# Patient Record
Sex: Female | Born: 1985 | Race: White | Hispanic: No | Marital: Married | Smoking: Never smoker
Health system: Southern US, Community
[De-identification: ages and names within clinical notes are randomized; demographics above are authoritative.]

## PROBLEM LIST (undated history)

## (undated) DIAGNOSIS — G43909 Migraine, unspecified, not intractable, without status migrainosus: Secondary | ICD-10-CM

## (undated) DIAGNOSIS — F32A Depression, unspecified: Secondary | ICD-10-CM

## (undated) DIAGNOSIS — F419 Anxiety disorder, unspecified: Secondary | ICD-10-CM

## (undated) DIAGNOSIS — I10 Essential (primary) hypertension: Secondary | ICD-10-CM

## (undated) DIAGNOSIS — G932 Benign intracranial hypertension: Secondary | ICD-10-CM

## (undated) DIAGNOSIS — F329 Major depressive disorder, single episode, unspecified: Secondary | ICD-10-CM

## (undated) HISTORY — DX: Depression, unspecified: F32.A

## (undated) HISTORY — DX: Anxiety disorder, unspecified: F41.9

## (undated) HISTORY — DX: Major depressive disorder, single episode, unspecified: F32.9

## (undated) HISTORY — DX: Essential (primary) hypertension: I10

## (undated) HISTORY — PX: CHOLECYSTECTOMY: SHX55

## (undated) HISTORY — DX: Benign intracranial hypertension: G93.2

---
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
## 2005-07-26 ENCOUNTER — Emergency Department: Payer: Self-pay | Admitting: Emergency Medicine

## 2005-11-11 ENCOUNTER — Emergency Department: Payer: Self-pay | Admitting: Emergency Medicine

## 2005-11-13 ENCOUNTER — Ambulatory Visit: Payer: Self-pay | Admitting: Emergency Medicine

## 2006-01-01 ENCOUNTER — Emergency Department: Payer: Self-pay | Admitting: Emergency Medicine

## 2006-08-21 ENCOUNTER — Emergency Department: Payer: Self-pay | Admitting: Internal Medicine

## 2006-11-11 ENCOUNTER — Emergency Department (HOSPITAL_COMMUNITY): Admission: EM | Admit: 2006-11-11 | Discharge: 2006-11-12 | Payer: Self-pay | Admitting: Emergency Medicine

## 2006-11-11 ENCOUNTER — Emergency Department: Payer: Self-pay | Admitting: General Practice

## 2007-03-03 ENCOUNTER — Encounter: Payer: Self-pay | Admitting: Obstetrics and Gynecology

## 2007-03-10 ENCOUNTER — Emergency Department: Payer: Self-pay | Admitting: Emergency Medicine

## 2007-04-02 ENCOUNTER — Observation Stay: Payer: Self-pay | Admitting: Obstetrics and Gynecology

## 2007-04-02 ENCOUNTER — Inpatient Hospital Stay: Payer: Self-pay | Admitting: Obstetrics and Gynecology

## 2008-02-14 ENCOUNTER — Emergency Department: Payer: Self-pay | Admitting: Emergency Medicine

## 2008-03-02 ENCOUNTER — Emergency Department: Payer: Self-pay | Admitting: Emergency Medicine

## 2008-08-11 ENCOUNTER — Emergency Department: Payer: Self-pay | Admitting: Internal Medicine

## 2008-09-23 ENCOUNTER — Emergency Department: Payer: Self-pay | Admitting: Emergency Medicine

## 2008-11-22 ENCOUNTER — Emergency Department: Payer: Self-pay | Admitting: Emergency Medicine

## 2008-11-29 ENCOUNTER — Emergency Department: Payer: Self-pay | Admitting: Unknown Physician Specialty

## 2009-02-07 ENCOUNTER — Observation Stay: Payer: Self-pay

## 2009-03-17 ENCOUNTER — Observation Stay: Payer: Self-pay

## 2009-03-23 ENCOUNTER — Observation Stay: Payer: Self-pay | Admitting: Obstetrics & Gynecology

## 2009-03-24 ENCOUNTER — Observation Stay: Payer: Self-pay

## 2009-03-26 ENCOUNTER — Inpatient Hospital Stay: Payer: Self-pay | Admitting: Obstetrics & Gynecology

## 2009-04-04 ENCOUNTER — Encounter: Payer: Self-pay | Admitting: Maternal & Fetal Medicine

## 2009-04-04 ENCOUNTER — Observation Stay: Payer: Self-pay

## 2009-04-07 ENCOUNTER — Encounter: Payer: Self-pay | Admitting: Obstetrics and Gynecology

## 2009-04-07 ENCOUNTER — Observation Stay: Payer: Self-pay | Admitting: Unknown Physician Specialty

## 2009-04-11 ENCOUNTER — Observation Stay: Payer: Self-pay

## 2009-04-14 ENCOUNTER — Observation Stay: Payer: Self-pay | Admitting: Obstetrics and Gynecology

## 2009-04-18 ENCOUNTER — Observation Stay: Payer: Self-pay | Admitting: Obstetrics & Gynecology

## 2009-04-21 ENCOUNTER — Inpatient Hospital Stay: Payer: Self-pay

## 2009-06-23 ENCOUNTER — Emergency Department: Payer: Self-pay | Admitting: Unknown Physician Specialty

## 2009-07-20 ENCOUNTER — Emergency Department: Payer: Self-pay | Admitting: Emergency Medicine

## 2009-08-09 ENCOUNTER — Emergency Department: Payer: Self-pay | Admitting: Emergency Medicine

## 2009-08-20 ENCOUNTER — Emergency Department: Payer: Self-pay | Admitting: Emergency Medicine

## 2009-10-13 ENCOUNTER — Emergency Department: Payer: Self-pay | Admitting: Emergency Medicine

## 2010-02-07 ENCOUNTER — Emergency Department: Payer: Self-pay | Admitting: Emergency Medicine

## 2010-03-05 ENCOUNTER — Emergency Department: Payer: Self-pay | Admitting: Emergency Medicine

## 2010-03-11 ENCOUNTER — Emergency Department: Payer: Self-pay | Admitting: Emergency Medicine

## 2010-06-18 ENCOUNTER — Emergency Department: Payer: Self-pay | Admitting: Emergency Medicine

## 2010-07-24 ENCOUNTER — Emergency Department: Payer: Self-pay | Admitting: Emergency Medicine

## 2010-08-04 ENCOUNTER — Emergency Department: Payer: Self-pay | Admitting: Emergency Medicine

## 2010-09-13 ENCOUNTER — Observation Stay: Payer: Self-pay

## 2010-09-25 ENCOUNTER — Observation Stay: Payer: Self-pay | Admitting: Obstetrics & Gynecology

## 2010-10-20 ENCOUNTER — Inpatient Hospital Stay: Payer: Self-pay | Admitting: Obstetrics and Gynecology

## 2010-12-17 ENCOUNTER — Emergency Department: Payer: Self-pay | Admitting: Emergency Medicine

## 2010-12-29 ENCOUNTER — Ambulatory Visit: Payer: Self-pay | Admitting: Urology

## 2011-01-02 ENCOUNTER — Ambulatory Visit: Payer: Self-pay | Admitting: Urology

## 2011-01-04 ENCOUNTER — Ambulatory Visit: Payer: Self-pay | Admitting: Urology

## 2011-01-07 ENCOUNTER — Emergency Department: Payer: Self-pay | Admitting: Emergency Medicine

## 2011-02-08 ENCOUNTER — Ambulatory Visit: Payer: Self-pay | Admitting: Urology

## 2011-03-03 ENCOUNTER — Emergency Department: Payer: Self-pay | Admitting: *Deleted

## 2011-05-19 ENCOUNTER — Emergency Department: Payer: Self-pay | Admitting: Emergency Medicine

## 2011-10-27 ENCOUNTER — Emergency Department: Payer: Self-pay | Admitting: Emergency Medicine

## 2012-02-20 ENCOUNTER — Emergency Department: Payer: Self-pay | Admitting: Emergency Medicine

## 2012-03-26 ENCOUNTER — Emergency Department: Payer: Self-pay | Admitting: Emergency Medicine

## 2012-03-26 LAB — URINALYSIS, COMPLETE
Bacteria: NONE SEEN
Glucose,UR: NEGATIVE mg/dL (ref 0–75)
Protein: NEGATIVE
Squamous Epithelial: 1

## 2012-03-26 LAB — PREGNANCY, URINE: Pregnancy Test, Urine: NEGATIVE m[IU]/mL

## 2012-09-12 ENCOUNTER — Emergency Department: Payer: Self-pay | Admitting: Emergency Medicine

## 2012-09-12 LAB — BASIC METABOLIC PANEL
Anion Gap: 5 — ABNORMAL LOW (ref 7–16)
Calcium, Total: 9.3 mg/dL (ref 8.5–10.1)
Co2: 28 mmol/L (ref 21–32)
EGFR (African American): >60
Glucose: 85 mg/dL (ref 65–99)
Osmolality: 279 (ref 275–301)

## 2012-09-12 LAB — CBC
HCT: 43.2 % (ref 35.0–47.0)
Platelet: 317 10*3/uL (ref 150–440)
RBC: 4.85 10*6/uL (ref 3.80–5.20)
RDW: 13.8 % (ref 11.5–14.5)

## 2012-09-12 LAB — URINALYSIS, COMPLETE
Ph: 8 (ref 4.5–8.0)
RBC,UR: 25 /HPF (ref 0–5)
Specific Gravity: 1.013 (ref 1.003–1.030)

## 2012-09-14 ENCOUNTER — Emergency Department: Payer: Self-pay | Admitting: Emergency Medicine

## 2012-09-14 LAB — COMPREHENSIVE METABOLIC PANEL
Albumin: 3.6 g/dL (ref 3.4–5.0)
Alkaline Phosphatase: 137 U/L — ABNORMAL HIGH (ref 50–136)
Anion Gap: 10 (ref 7–16)
BUN: 10 mg/dL (ref 7–18)
Bilirubin,Total: 0.4 mg/dL (ref 0.2–1.0)
Calcium, Total: 9.4 mg/dL (ref 8.5–10.1)
Chloride: 105 mmol/L (ref 98–107)
Co2: 23 mmol/L (ref 21–32)
Creatinine: 0.62 mg/dL (ref 0.60–1.30)
EGFR (African American): >60
EGFR (Non-African Amer.): >60
Glucose: 86 mg/dL (ref 65–99)
Osmolality: 274 (ref 275–301)
Potassium: 4.2 mmol/L (ref 3.5–5.1)
SGOT(AST): 19 U/L (ref 15–37)
SGPT (ALT): 21 U/L (ref 12–78)
Sodium: 138 mmol/L (ref 136–145)
Total Protein: 7.2 g/dL (ref 6.4–8.2)

## 2012-09-15 LAB — TSH: Thyroid Stimulating Horm: 0.434 u[IU]/mL — ABNORMAL LOW

## 2012-09-17 ENCOUNTER — Emergency Department: Payer: Self-pay | Admitting: Emergency Medicine

## 2012-11-08 ENCOUNTER — Ambulatory Visit: Payer: Self-pay | Admitting: Internal Medicine

## 2012-12-22 ENCOUNTER — Emergency Department: Payer: Self-pay | Admitting: Emergency Medicine

## 2013-01-04 ENCOUNTER — Emergency Department: Payer: Self-pay | Admitting: Emergency Medicine

## 2013-01-04 LAB — BASIC METABOLIC PANEL
BUN: 13 mg/dL (ref 7–18)
Calcium, Total: 9 mg/dL (ref 8.5–10.1)
Creatinine: 0.54 mg/dL — ABNORMAL LOW (ref 0.60–1.30)
EGFR (African American): >60
EGFR (Non-African Amer.): >60
Glucose: 77 mg/dL (ref 65–99)
Osmolality: 278 (ref 275–301)
Sodium: 140 mmol/L (ref 136–145)

## 2013-06-14 ENCOUNTER — Emergency Department: Payer: Self-pay | Admitting: Emergency Medicine

## 2013-06-14 LAB — CBC WITH DIFFERENTIAL/PLATELET
Basophil %: 0.5 %
Eosinophil %: 0.8 %
HGB: 13.3 g/dL (ref 12.0–16.0)
MCHC: 33.4 g/dL (ref 32.0–36.0)
MCV: 89 fL (ref 80–100)
Monocyte %: 7 %
Neutrophil %: 73.5 %
RBC: 4.46 10*6/uL (ref 3.80–5.20)
WBC: 14.1 10*3/uL — ABNORMAL HIGH (ref 3.6–11.0)

## 2013-06-14 LAB — URINALYSIS, COMPLETE
Bacteria: NONE SEEN
Blood: NEGATIVE
Glucose,UR: NEGATIVE mg/dL (ref 0–75)
Leukocyte Esterase: NEGATIVE
Nitrite: POSITIVE
Specific Gravity: 1.033 (ref 1.003–1.030)
Squamous Epithelial: 7
WBC UR: 2 /HPF (ref 0–5)

## 2013-06-14 LAB — COMPREHENSIVE METABOLIC PANEL
Anion Gap: 6 — ABNORMAL LOW (ref 7–16)
Chloride: 108 mmol/L — ABNORMAL HIGH (ref 98–107)
Co2: 24 mmol/L (ref 21–32)
EGFR (Non-African Amer.): >60
Osmolality: 278 (ref 275–301)
Potassium: 3.8 mmol/L (ref 3.5–5.1)
SGOT(AST): 18 U/L (ref 15–37)
SGPT (ALT): 20 U/L (ref 12–78)

## 2013-06-18 DIAGNOSIS — G932 Benign intracranial hypertension: Secondary | ICD-10-CM

## 2013-06-18 HISTORY — DX: Benign intracranial hypertension: G93.2

## 2013-07-09 ENCOUNTER — Emergency Department: Payer: Self-pay | Admitting: Emergency Medicine

## 2013-08-05 ENCOUNTER — Emergency Department: Payer: Self-pay | Admitting: Emergency Medicine

## 2013-08-05 LAB — URINALYSIS, COMPLETE
Bilirubin,UR: NEGATIVE
Blood: NEGATIVE
GLUCOSE, UR: NEGATIVE mg/dL (ref 0–75)
Ketone: NEGATIVE
Leukocyte Esterase: NEGATIVE
NITRITE: NEGATIVE
PROTEIN: NEGATIVE
Ph: 5 (ref 4.5–8.0)
RBC,UR: 1 /HPF (ref 0–5)
Specific Gravity: 1.023 (ref 1.003–1.030)
Squamous Epithelial: 8

## 2013-08-05 LAB — COMPREHENSIVE METABOLIC PANEL
ALBUMIN: 3.3 g/dL — AB (ref 3.4–5.0)
ALK PHOS: 147 U/L — AB
ALT: 16 U/L (ref 12–78)
AST: 18 U/L (ref 15–37)
Anion Gap: 3 — ABNORMAL LOW (ref 7–16)
BUN: 11 mg/dL (ref 7–18)
Bilirubin,Total: 0.3 mg/dL (ref 0.2–1.0)
CALCIUM: 9.5 mg/dL (ref 8.5–10.1)
CHLORIDE: 106 mmol/L (ref 98–107)
CO2: 27 mmol/L (ref 21–32)
CREATININE: 0.62 mg/dL (ref 0.60–1.30)
EGFR (African American): >60
Glucose: 110 mg/dL — ABNORMAL HIGH (ref 65–99)
OSMOLALITY: 272 (ref 275–301)
POTASSIUM: 4.3 mmol/L (ref 3.5–5.1)
SODIUM: 136 mmol/L (ref 136–145)
Total Protein: 7.6 g/dL (ref 6.4–8.2)

## 2013-08-05 LAB — CBC WITH DIFFERENTIAL/PLATELET
BASOS ABS: 0.1 10*3/uL (ref 0.0–0.1)
Basophil %: 0.7 %
EOS ABS: 0.2 10*3/uL (ref 0.0–0.7)
EOS PCT: 1.1 %
HCT: 42 % (ref 35.0–47.0)
HGB: 14 g/dL (ref 12.0–16.0)
Lymphocyte #: 3.6 10*3/uL (ref 1.0–3.6)
Lymphocyte %: 26.9 %
MCH: 30.3 pg (ref 26.0–34.0)
MCHC: 33.4 g/dL (ref 32.0–36.0)
MCV: 91 fL (ref 80–100)
MONO ABS: 0.6 x10 3/mm (ref 0.2–0.9)
MONOS PCT: 4.5 %
NEUTROS ABS: 9 10*3/uL — AB (ref 1.4–6.5)
Neutrophil %: 66.8 %
PLATELETS: 313 10*3/uL (ref 150–440)
RBC: 4.62 10*6/uL (ref 3.80–5.20)
RDW: 13.9 % (ref 11.5–14.5)
WBC: 13.5 10*3/uL — ABNORMAL HIGH (ref 3.6–11.0)

## 2013-08-05 LAB — PREGNANCY, URINE: Pregnancy Test, Urine: NEGATIVE m[IU]/mL

## 2014-08-06 ENCOUNTER — Ambulatory Visit: Payer: Self-pay | Admitting: Ophthalmology

## 2014-08-19 ENCOUNTER — Emergency Department: Payer: Self-pay | Admitting: Emergency Medicine

## 2014-09-07 ENCOUNTER — Ambulatory Visit: Payer: Self-pay | Admitting: Neurology

## 2014-09-18 LAB — CSF CULTURE

## 2015-02-16 ENCOUNTER — Other Ambulatory Visit: Payer: Self-pay | Admitting: Neurology

## 2015-02-16 DIAGNOSIS — G932 Benign intracranial hypertension: Secondary | ICD-10-CM

## 2015-02-25 ENCOUNTER — Ambulatory Visit
Admission: RE | Admit: 2015-02-25 | Discharge: 2015-02-25 | Disposition: A | Discharge status: Home / Self Care | Payer: Medicaid Other | Source: Ambulatory Visit | Attending: Neurology | Admitting: Neurology

## 2015-02-25 DIAGNOSIS — G932 Benign intracranial hypertension: Secondary | ICD-10-CM | POA: Insufficient documentation

## 2015-02-25 HISTORY — DX: Migraine, unspecified, not intractable, without status migrainosus: G43.909

## 2015-02-25 LAB — CBC
HCT: 39.9 % (ref 35.0–47.0)
Hemoglobin: 13.2 g/dL (ref 12.0–16.0)
MCH: 29.4 pg (ref 26.0–34.0)
MCHC: 33.1 g/dL (ref 32.0–36.0)
MCV: 88.8 fL (ref 80.0–100.0)
PLATELETS: 313 10*3/uL (ref 150–440)
RBC: 4.5 MIL/uL (ref 3.80–5.20)
RDW: 14.3 % (ref 11.5–14.5)
WBC: 10.3 10*3/uL (ref 3.6–11.0)

## 2015-02-25 LAB — PROTIME-INR
INR: 0.97
PROTHROMBIN TIME: 13.1 s (ref 11.4–15.0)

## 2015-02-25 LAB — APTT: APTT: 31 s (ref 24–36)

## 2015-02-25 LAB — HCG, QUANTITATIVE, PREGNANCY: hCG, Beta Chain, Quant, S: <1 m[IU]/mL (ref <5)

## 2015-02-25 MED ORDER — ACETAMINOPHEN 500 MG PO TABS
1000.0000 mg | ORAL_TABLET | Freq: Four times a day (QID) | ORAL | Status: DC | PRN
  Administered 2015-02-25: 1000 mg via ORAL
  Filled 2015-02-25: qty 2

## 2015-02-25 NOTE — OR Nursing (Signed)
Dr Allena Katz notified that pt reports headache down to 2/10, requesting to leave, Dr Gildardo Cranker leaving at 2 hour post. Pt informed.

## 2015-02-25 NOTE — OR Nursing (Signed)
Dr Allena Katz notified of headache, he reports he was aware, plan to keep 4 hours for headache, unless change and will reassess length of stay after pain medication administration

## 2015-04-12 ENCOUNTER — Ambulatory Visit (INDEPENDENT_AMBULATORY_CARE_PROVIDER_SITE_OTHER): Payer: Medicaid Other | Admitting: Obstetrics and Gynecology

## 2015-04-12 ENCOUNTER — Other Ambulatory Visit: Payer: Medicaid Other

## 2015-04-12 VITALS — BP 134/84 | HR 83 | Wt 292.3 lb

## 2015-04-12 DIAGNOSIS — N898 Other specified noninflammatory disorders of vagina: Secondary | ICD-10-CM | POA: Diagnosis not present

## 2015-04-12 DIAGNOSIS — Z113 Encounter for screening for infections with a predominantly sexual mode of transmission: Secondary | ICD-10-CM

## 2015-04-12 DIAGNOSIS — Z8759 Personal history of other complications of pregnancy, childbirth and the puerperium: Secondary | ICD-10-CM

## 2015-04-12 DIAGNOSIS — Z3687 Encounter for antenatal screening for uncertain dates: Secondary | ICD-10-CM

## 2015-04-12 DIAGNOSIS — N939 Abnormal uterine and vaginal bleeding, unspecified: Secondary | ICD-10-CM

## 2015-04-12 DIAGNOSIS — Z331 Pregnant state, incidental: Secondary | ICD-10-CM | POA: Diagnosis not present

## 2015-04-12 DIAGNOSIS — R638 Other symptoms and signs concerning food and fluid intake: Secondary | ICD-10-CM

## 2015-04-12 DIAGNOSIS — O34219 Maternal care for unspecified type scar from previous cesarean delivery: Secondary | ICD-10-CM

## 2015-04-12 DIAGNOSIS — Z1389 Encounter for screening for other disorder: Secondary | ICD-10-CM

## 2015-04-12 DIAGNOSIS — Z36 Encounter for antenatal screening of mother: Secondary | ICD-10-CM

## 2015-04-12 NOTE — Patient Instructions (Signed)

## 2015-04-12 NOTE — Progress Notes (Signed)
I have reviewed the information as provided through NOB nurse intake.  Patient with complaints of vaginal spotting for several days, ultrasound performed today for viability, dating. Ultrasound notes viable singleton IUP at 6.2 weeks, [facility].  Bleeding precautions given. RTC in 4 weeks for NOB visit.

## 2015-04-12 NOTE — Progress Notes (Signed)
Barbara Hayes presents for NOB nurse interview visit. G-3.  [id number]. Pregnancy eduction material explained and given. No cats in the home. NOB labs ordered. TSH/HbgA1c due to Increased BMI. HIV labs and Drug screen were explained optional and she could opt out of tests but did not decline. Drug screen ordered. PNV encouraged. Pt would like to do [facility] genetic testing. Pt. To follow up with provider in 4 weeks for NOB physical or sooner if problems. Pt had some vaginal bleeding when wiped all day Sunday and last night when wiped-bright red. Pt had fraternal twins (conceived 5 days apart) [facility]. Ultrasound ordered for viability and dating, hx twins, and vaginal spotting. This was done today. Pt measured 6wks 2days by US. One fetus noted. All questions answered.    ZIKA EXPOSURE SCREEN:  The patient has not traveled to a [place] endemic area within the past 6 months, nor has she had unprotected sex with a partner who has travelled to a [place] endemic region within the past 6 months. The patient has been advised to notify us if these factors change any time during this current pregnancy, so adequate testing and monitoring can be initiated.

## 2015-04-13 LAB — CBC WITH DIFFERENTIAL/PLATELET
BASOS ABS: 0 10*3/uL (ref 0.0–0.2)
Basos: 0 %
EOS (ABSOLUTE): 0.2 10*3/uL (ref 0.0–0.4)
EOS: 2 %
HEMOGLOBIN: 14.2 g/dL (ref 11.1–15.9)
Hematocrit: 42.1 % (ref 34.0–46.6)
IMMATURE GRANULOCYTES: 0 %
Immature Grans (Abs): 0 10*3/uL (ref 0.0–0.1)
LYMPHS ABS: 2.7 10*3/uL (ref 0.7–3.1)
Lymphs: 27 %
MCH: 30 pg (ref 26.6–33.0)
MCHC: 33.7 g/dL (ref 31.5–35.7)
MCV: 89 fL (ref 79–97)
MONOCYTES: 7 %
MONOS ABS: 0.7 10*3/uL (ref 0.1–0.9)
NEUTROS PCT: 64 %
Neutrophils Absolute: 6.5 10*3/uL (ref 1.4–7.0)
Platelets: 356 10*3/uL (ref 150–379)
RBC: 4.73 x10E6/uL (ref 3.77–5.28)
RDW: 14.3 % (ref 12.3–15.4)
WBC: 10.1 10*3/uL (ref 3.4–10.8)

## 2015-04-13 LAB — TSH: TSH: 0.804 u[IU]/mL (ref 0.450–4.500)

## 2015-04-13 LAB — PAIN MGT SCRN (14 DRUGS), UR
AMPHETAMINE SCRN UR: NEGATIVE ng/mL
BUPRENORPHINE, URINE: NEGATIVE ng/mL
Barbiturate Screen, Ur: NEGATIVE ng/mL
Benzodiazepine Screen, Urine: NEGATIVE ng/mL
COCAINE(METAB.) SCREEN, URINE: NEGATIVE ng/mL
Cannabinoids Ur Ql Scn: NEGATIVE ng/mL
Creatinine(Crt), U: 91.2 mg/dL (ref 20.0–300.0)
FENTANYL, URINE: NEGATIVE pg/mL
MEPERIDINE SCREEN, URINE: NEGATIVE ng/mL
METHADONE SCREEN, URINE: NEGATIVE ng/mL
OPIATE SCRN UR: NEGATIVE ng/mL
OXYCODONE+OXYMORPHONE UR QL SCN: NEGATIVE ng/mL
PCP SCRN UR: NEGATIVE ng/mL
PROPOXYPHENE SCREEN: NEGATIVE ng/mL
Ph of Urine: 6.1 (ref 4.5–8.9)
TRAMADOL UR QL SCN: NEGATIVE ng/mL

## 2015-04-13 LAB — VARICELLA ZOSTER ANTIBODY, IGM

## 2015-04-13 LAB — URINALYSIS, ROUTINE W REFLEX MICROSCOPIC
Bilirubin, UA: NEGATIVE
GLUCOSE, UA: NEGATIVE
Ketones, UA: NEGATIVE
Leukocytes, UA: NEGATIVE
NITRITE UA: NEGATIVE
PH UA: 6.5 (ref 5.0–7.5)
Protein, UA: NEGATIVE
RBC, UA: NEGATIVE
Specific Gravity, UA: 1.017 (ref 1.005–1.030)
UUROB: 0.2 mg/dL (ref 0.2–1.0)

## 2015-04-13 LAB — RUBELLA ANTIBODY, IGM: Rubella IgM: <20 AU/mL (ref 0.0–19.9)

## 2015-04-13 LAB — GC/CHLAMYDIA PROBE AMP
CHLAMYDIA, DNA PROBE: NEGATIVE
NEISSERIA GONORRHOEAE BY PCR: NEGATIVE

## 2015-04-13 LAB — RPR: RPR Ser Ql: NONREACTIVE

## 2015-04-13 LAB — HIV ANTIBODY (ROUTINE TESTING W REFLEX): HIV Screen 4th Generation wRfx: NONREACTIVE

## 2015-04-13 LAB — NICOTINE SCREEN, URINE: Cotinine Ql Scrn, Ur: NEGATIVE ng/mL

## 2015-04-13 LAB — ANTIBODY SCREEN: ANTIBODY SCREEN: NEGATIVE

## 2015-04-13 LAB — ABO

## 2015-04-13 LAB — HEMOGLOBIN A1C
Est. average glucose Bld gHb Est-mCnc: 105 mg/dL
HEMOGLOBIN A1C: 5.3 % (ref 4.8–5.6)

## 2015-04-13 LAB — RH TYPE: Rh Factor: POSITIVE

## 2015-04-13 LAB — HEPATITIS B SURFACE ANTIGEN: Hepatitis B Surface Ag: NEGATIVE

## 2015-04-14 LAB — URINE CULTURE

## 2015-04-18 ENCOUNTER — Encounter: Payer: Self-pay | Admitting: Certified Nurse Midwife

## 2015-04-20 ENCOUNTER — Telehealth: Payer: Self-pay | Admitting: Obstetrics and Gynecology

## 2015-04-20 MED ORDER — AZITHROMYCIN 250 MG PO TABS: ORAL_TABLET | ORAL | Status: DC

## 2015-04-20 NOTE — Telephone Encounter (Signed)
PT CALLED AND SHE HAS BEEN BATTLING SINUS CONGESTION AND PRESSURE FOR ABOUT 2 WEEKS NOT, SHE CAME IN LAST WEEK FOR HER NURSE OB INTAKE AND GOT THE MEDICATIONS THAT SHE CAN TAKE OVER THE COUNTER, AND SHE HAS BEEN TAKING THE, FOR ABOUT A WEEK AND HAS GOTTEN WORSE, SHE SEES YOU ON 11/22 FOR HER NEW OB PHYSICAL, SHE WANTED TO KNOW IF SHE COULD GET AN ANTIBIOTIC CALLED IN FOR HER, HER SYMPTOMS, CONGESTION, COUGHING EAR PAIN, CHILLS, NO FEVER, SORE THROAT. SHE USES [facility] IN [place].

## 2015-04-20 NOTE — Telephone Encounter (Signed)
Sure.  I can call in a Z-pack for her.  Continue to encourage her to use robitussion/throat lozenges for cough, and can use saline nasal spray for congestion and can use Mucinex (just the plain, no DM).

## 2015-04-21 MED ORDER — AMOXICILLIN-POT CLAVULANATE 875-125 MG PO TABS: 1.0000 | ORAL_TABLET | Freq: Two times a day (BID) | ORAL | Status: DC

## 2015-04-21 NOTE — Telephone Encounter (Signed)
I will change it to Amoxcillin.

## 2015-04-21 NOTE — Telephone Encounter (Signed)
CALLED PT AND SHE STATED THAT HER MEDICAID WILL NOT COVER THE Z-PACK, IS THERE SOMETHING ELSE THAT CAN BE CALLED IN, SHE WILL CONTINUE TO USE ALL THE OTHER THINGS AS WELL.

## 2015-04-21 NOTE — Telephone Encounter (Signed)
Contacted patient regarding medication.  Discussed allergy history of PCN, notes that she can take other forms of PCN, just not PCN G.  Will prescribe Augmentin BID x 7 days

## 2015-04-21 NOTE — Addendum Note (Signed)
Addended by: Fabian NovemberHERRY, Slater Mcmanaman S on: 04/21/2015 05:34 PM   Modules accepted: Orders

## 2015-04-21 NOTE — Addendum Note (Signed)
Addended by: Fabian NovemberHERRY, Eastin Swing S on: 04/21/2015 05:31 PM   Modules accepted: Orders, Medications

## 2015-05-09 ENCOUNTER — Telehealth: Payer: Self-pay

## 2015-05-09 NOTE — Telephone Encounter (Signed)
Ob pt states she was having a sharp pain on left side of abd that radiates to the back last nite. Came and went. This am she had some spotting on the TP when she wiped. NO uti sx. NO v/d. No fever. Mild nausea. Tylenol helps with the cramps. Pt does have a nob PE in the am. [facility] Advised she can come by office for u/a and culture. Pt states she will monitor for now. If bleeding gets heavy or cramps not relived by tylenol then she needs to go to the ER. Pt voices understanding.

## 2015-05-10 ENCOUNTER — Ambulatory Visit (INDEPENDENT_AMBULATORY_CARE_PROVIDER_SITE_OTHER): Payer: Medicaid Other | Admitting: Obstetrics and Gynecology

## 2015-05-10 ENCOUNTER — Ambulatory Visit: Payer: Medicaid Other

## 2015-05-10 ENCOUNTER — Encounter: Payer: Self-pay | Admitting: Obstetrics and Gynecology

## 2015-05-10 VITALS — BP 133/83 | HR 83 | Wt 290.6 lb

## 2015-05-10 DIAGNOSIS — Z3491 Encounter for supervision of normal pregnancy, unspecified, first trimester: Secondary | ICD-10-CM

## 2015-05-10 DIAGNOSIS — R399 Unspecified symptoms and signs involving the genitourinary system: Secondary | ICD-10-CM

## 2015-05-10 DIAGNOSIS — Z8679 Personal history of other diseases of the circulatory system: Secondary | ICD-10-CM

## 2015-05-10 DIAGNOSIS — Z6841 Body Mass Index (BMI) 40.0 and over, adult: Secondary | ICD-10-CM

## 2015-05-10 DIAGNOSIS — R7309 Other abnormal glucose: Secondary | ICD-10-CM | POA: Diagnosis not present

## 2015-05-10 LAB — POCT URINALYSIS DIPSTICK
Bilirubin, UA: NEGATIVE
GLUCOSE UA: NEGATIVE
Ketones, UA: NEGATIVE
Leukocytes, UA: NEGATIVE
Nitrite, UA: NEGATIVE
Protein, UA: NEGATIVE
SPEC GRAV UA: 1.015
Urobilinogen, UA: NEGATIVE
pH, UA: 6.5

## 2015-05-10 MED ORDER — VENLAFAXINE HCL ER 75 MG PO CP24: 75.0000 mg | ORAL_CAPSULE | Freq: Every day | ORAL | Status: AC

## 2015-05-10 MED ORDER — PRENATAL 27-0.8 MG PO TABS: 1.0000 | ORAL_TABLET | Freq: Every day | ORAL | Status: AC

## 2015-05-10 NOTE — Progress Notes (Signed)
INITIAL OBSTETRIC PRENATAL CLINIC PROGRESS NOTE  Subjective:    Barbara Hayes is being seen today for her first obstetrical visit.  This is not a planned pregnancy. She is at [redacted]w[redacted]d gestation by Patient's last menstrual period was 02/25/2015 (approximate). Estimated Date of Delivery: 12/02/15. Her obstetrical history is significant for pregnancy induced hypertension in a prior pregnancy, anxiety/depression, h/o preterm delivery x 2, migraines, morbid obesity, and h/o prior C-section x 1 with subsequent VBAC. Relationship with FOB: spouse, living together. Patient does intend to breast feed. Pregnancy history fully reviewed.  Menstrual History: Obstetric History   G4   P3   T1   P2   A0   TAB0   SAB0   E0   M1   L4     # Outcome Date GA Lbr Len/2nd Weight Sex Delivery Anes PTL Lv  4 Current           3 Preterm 2012 [redacted]w[redacted]d  6 lb 6 oz (2.892 kg) F VBAC   Y  2A Preterm 2010 [redacted]w[redacted]d  5 lb (2.268 kg) F Vag-Spont   Y  2B Preterm 2010 [redacted]w[redacted]d  4 lb 6 oz (1.984 kg) F CS-Unspec   Y  1 Term 2008 [redacted]w[redacted]d  7 lb 9 oz (3.43 kg) M Vag-Spont   Y    Obstetric Comments  G1: Delivery problem: vacum needed, tore, then cut and was stitched from inside out; put to sleep to do the surgery and had blood transfusion.  G2: Gestational HTN requiring medications until 6 weeks postpartum.  Twin pregnancy    Menarche age: 33 Patient's last menstrual period was 02/25/2015 (approximate).  Denies h/o abnormal pap smears or STIs.  Last pap smear was ~ 1-2 years ago.   Past Medical History  Diagnosis Date  . Migraines   . Hypertension   . IIH (idiopathic intracranial hypertension) 2015    Past Surgical History  Procedure Laterality Date  . Cesarean section      Family History  Problem Relation Age of Onset  . Hypertension Father     Social History   Social History  . Marital Status: Married    Spouse Name: N/A  . Number of Children: N/A  . Years of Education: N/A   Occupational History  . Not on file.    Social History Main Topics  . Smoking status: Never Smoker   . Smokeless tobacco: Never Used  . Alcohol Use: No  . Drug Use: No  . Sexual Activity:    Partners: Male   Other Topics Concern  . Not on file   Social History Narrative    Current Outpatient Prescriptions on File Prior to Visit  Medication Sig Dispense Refill  . busPIRone (BUSPAR) 7.5 MG tablet Take 7.5 mg by mouth 2 (two) times daily.    Marland Kitchen LORazepam (ATIVAN) 0.5 MG tablet Take 0.5 mg by mouth every 8 (eight) hours as needed for anxiety.    . traZODone (DESYREL) 50 MG tablet Take 50 mg by mouth at bedtime.     No current facility-administered medications on file prior to visit.    Allergies  Allergen Reactions  . Penicillin G Anaphylaxis  . Sulfa Antibiotics Anaphylaxis    Review of Systems General:Not Present- Fever, Weight Loss and Weight Gain. Skin:Not Present- Rash. HEENT:Present - Headaches. Not Present- Blurred Vision, Bleeding Gums. Respiratory:Not Present- Difficulty Breathing. Breast:Not Present- Breast Mass. Cardiovascular:Not Present- Chest Pain, Elevated Blood Pressure, Fainting / Blacking Out and Shortness of Breath. Gastrointestinal:Not Present- Abdominal  Pain, Constipation, Nausea and Vomiting. Female Genitourinary:Not Present- Frequency, Painful Urination, Pelvic Pain, Vaginal Bleeding, Vaginal Discharge, Contractions, regular, Fetal Movements Decreased, Urinary Complaints and Vaginal Fluid. Musculoskeletal:Not Present- Back Pain and Leg Cramps. Neurological:Not Present- Dizziness. Psychiatric:Present - Anxiety. Not Present- Depression.    Objective:  Blood pressure 133/83, pulse 83, weight 290 lb 9.6 oz (131.815 kg), last menstrual period 02/25/2015. Body mass index is 53.14 kg/(m^2).    General Appearance:    Alert, cooperative, no distress, appears stated age, morbid obesity  Head:    Normocephalic, without obvious abnormality, atraumatic  Eyes:    PERRL, conjunctiva/corneas  clear, EOM's intact, both eyes  Ears:    Normal external ear canals, both ears  Nose:   Nares normal, septum midline, mucosa normal, no drainage or sinus tenderness  Throat:   Lips, mucosa, and tongue normal; teeth and gums normal  Neck:   Supple, symmetrical, trachea midline, no adenopathy; thyroid: no enlargement/tenderness/nodules; no carotid bruit or JVD  Back:     Symmetric, no curvature, ROM normal, no CVA tenderness  Lungs:     Clear to auscultation bilaterally, respirations unlabored  Chest Wall:    No tenderness or deformity   Heart:    Regular rate and rhythm, S1 and S2 normal, no murmur, rub or gallop  Breast Exam:    No tenderness, masses, or nipple abnormality  Abdomen:     Soft, non-tender, bowel sounds active all four quadrants, no masses, no organomegaly.  FH and FHT unable to be appreciated due to GA and body habitus.  Genitalia:    Pelvic:external genitalia normal, vagina with lesions, discharge, or tenderness, rectovaginal septum  normal. Cervix normal in appearance, no cervical motion tenderness, no adnexal masses or tenderness.  Unable to palpate uterine size due to body habitus.   Rectal:    Normal external sphincter.  No hemorrhoids appreciated. Internal exam not done.   Extremities:   Extremities normal, atraumatic, no cyanosis or edema  Pulses:   2+ and symmetric all extremities  Skin:   Skin color, texture, turgor normal, no rashes or lesions  Lymph nodes:   Cervical, supraclavicular, and axillary nodes normal  Neurologic:   CNII-XII intact, normal strength, sensation and reflexes throughout     Imaging - [facility] 05/10/2015:  Indications:viability Findings:  Singleton intrauterine pregnancy is visualized with a CRL consistent with 11 0/[redacted] weeks gestation, giving an (U/S) EDD of 11/29/15.The (U/S) EDD is consistent with the clinically established (LMP) EDD of 12/02/15.  FHR: 163 CRL measurement: 41 mm Yolk sac was not visualized. Early anatomy is normal.  Right  Ovary measures 3.7 x 2.8 x 3.4 cm. It is normal in appearance. Left Ovary was not visualized. Corpus Luteum was not visualized. Survey of the adnexa demonstrates no adnexal masses. There is no free peritoneal fluid in the cul de sac.  Impression: 1. 11 0/7 week Viable Singleton Intrauterine pregnancy by U/S. 2. (U/S) EDD is consistent with Clinically established (LMP) EDD of 12/02/15.  Recommendations: 1.Clinical correlation with the patient's History and Physical Exam.  Assessment:   Pregnancy at 10 and 4/7 weeks   Pregnancy induced hypertension in a prior pregnancy Anxiety/depression  H/o preterm delivery x 2 (1 delivery was twins) Morbid obesity H/o prior C-section x 1 with subsequent VBAC IIH (Idiopathic Intracranial Hypertension) Migraines   Plan:  1) Pregnancy at 10.4 weeks  Initial labs reviewed. Pap smear up to date.  Prenatal vitamins encouraged. Problem list reviewed and updated. New OB counseling:  The patient  has been given an overview regarding routine prenatal care. Recommendations regarding diet, weight gain, and exercise in pregnancy were given.  Patient advised to gain no more than 15 lbs this pregnancy.  Prenatal testing, optional genetic testing, and ultrasound use in pregnancy were reviewed.  Patient desires genetic testing with [facility]. Benefits of Breast Feeding were discussed. The patient is encouraged to consider nursing her baby post partum.  2) H/o PIH in prior pregnancy Advised on beginning aspirin 81 mg beginning in 2nd trimester Will montior BPs closely  3) Anxiety/depression Patient reports discontinuing all meds after discovering pregnancy.  Notes that depressive symptoms are not present, but anxiety is very high.  Has taken Lorazepam for anxiety on at least 2 occasions.  Advised on discontinuation of Lorazapam. Can resume use of Effexor (was on medication prior to pregnancy and discontinued).  Do not resume Buspar (patient notes it was not  working well for her) and Trazadone.  Discussed need for weaning at approximately 32-34 weeks.   4) H/o preterm delivery x 2 Would recommend beginning 17-OHP for h/o preterm deliveries as patient is at risk for preterm delivery again.  Will also need to be scheduled for cervical length between 14-16 weeks.   5) Morbid obesity Not to gain more than 15 lbs this pregnancy.   Patient may need to be referred to tertiary center for further management of pregnancy due to morbid obesity and anesthesia concerns. To discuss at next visit.   6) H/o prior C-section x 1 with subsequent VBAC  Counseled patient on repeat C-section vs TOLAC.  Desires another TOLAC.  Counseled on risk of uterine rupture at term is 0.78 percent with TOLAC and 0.22 percent with ERCD. 1 in 10 uterine ruptures will result in neonatal death or neurological injury. The benefits of a trial of labor after cesarean (TOLAC) resulting in a vaginal birth after cesarean (VBAC) include the following: shorter length of hospital stay and postpartum recovery (in most cases); fewer complications, such as postpartum fever, wound or uterine infection, thromboembolism (blood clots in the leg or lung), need for blood transfusion and fewer neonatal breathing problems.  The risks of an attempted VBAC or TOLAC include the following: Risk of failed trial of labor after cesarean (TOLAC) without a vaginal birth after cesarean (VBAC) resulting in repeat cesarean delivery (RCD) in about 20 to 40 percent of women who attempt VBAC.  Risk of rupture of uterus resulting in an emergency cesarean delivery. The risk of uterine rupture may be related in part to the type of uterine incision made during the first cesarean delivery. A previous transverse uterine incision has the lowest risk of rupture (0.2 to 1.5 percent risk). Vertical or T-shaped uterine incisions have a higher risk of uterine rupture (4 to 9 percent risk)The risk of fetal death is very low with both VBAC  and elective repeat cesarean delivery (ERCD), but the likelihood of fetal death is higher with VBAC than with ERCD. Maternal death is very rare with either type of delivery. The risks of an elective repeat cesarean delivery (ERCD) were reviewed with the patient including but not limited to: 07/998 risk of uterine rupture which could have serious consequences, bleeding which may require transfusion; infection which may require antibiotics; injury to bowel, bladder or other surrounding organs (bowel, bladder, ureters); injury to the fetus; need for additional procedures including hysterectomy in the event of a life-threatening hemorrhage; thromboembolic phenomenon; abnormal placentation; incisional problems; death and other postoperative or anesthesia complications.   Patient notes  understanding of all and still desires VBAC.    7) H/o IIH Patient notes that symptoms usually include worsening headaches.  Currently has a h/o migraines and PIH in prior pregnancy.  Will need to monitor headaches closely to differentiate type of headache.  Will refer as necessary.  8) Migraines Informed patient that most with migraines improve during the pregnancy.  Can use Tylenol or Excedrin Migraine if needed.    Follow up in 4 weeks.  30% of 45 min visit spent on counseling and coordination of care.      Hildred Laser, MD [facility]

## 2015-05-10 NOTE — Patient Instructions (Signed)
First Trimester of Pregnancy The first trimester of pregnancy is from week 1 until the end of week 12 (months 1 through 3). A week after a sperm fertilizes an egg, the egg will implant on the wall of the uterus. This embryo will begin to develop into a baby. Genes from you and your partner are forming the baby. The female genes determine whether the baby is a boy or a girl. At 6-8 weeks, the eyes and face are formed, and the heartbeat can be seen on ultrasound. At the end of 12 weeks, all the baby's organs are formed.  Now that you are pregnant, you will want to do everything you can to have a healthy baby. Two of the most important things are to get good prenatal care and to follow your health care provider's instructions. Prenatal care is all the medical care you receive before the baby's birth. This care will help prevent, find, and treat any problems during the pregnancy and childbirth. BODY CHANGES Your body goes through many changes during pregnancy. The changes vary from woman to woman.   You may gain or lose a couple of pounds at first.  You may feel sick to your stomach (nauseous) and throw up (vomit). If the vomiting is uncontrollable, call your health care provider.  You may tire easily.  You may develop headaches that can be relieved by medicines approved by your health care provider.  You may urinate more often. Painful urination may mean you have a bladder infection.  You may develop heartburn as a result of your pregnancy.  You may develop constipation because certain hormones are causing the muscles that push waste through your intestines to slow down.  You may develop hemorrhoids or swollen, bulging veins (varicose veins).  Your breasts may begin to grow larger and become tender. Your nipples may stick out more, and the tissue that surrounds them (areola) may become darker.  Your gums may bleed and may be sensitive to brushing and flossing.  Dark spots or blotches (chloasma,  mask of pregnancy) may develop on your face. This will likely fade after the baby is born.  Your menstrual periods will stop.  You may have a loss of appetite.  You may develop cravings for certain kinds of food.  You may have changes in your emotions from day to day, such as being excited to be pregnant or being concerned that something may go wrong with the pregnancy and baby.  You may have more vivid and strange dreams.  You may have changes in your hair. These can include thickening of your hair, rapid growth, and changes in texture. Some women also have hair loss during or after pregnancy, or hair that feels dry or thin. Your hair will most likely return to normal after your baby is born. WHAT TO EXPECT AT YOUR PRENATAL VISITS During a routine prenatal visit:  You will be weighed to make sure you and the baby are growing normally.  Your blood pressure will be taken.  Your abdomen will be measured to track your baby's growth.  The fetal heartbeat will be listened to starting around week 10 or 12 of your pregnancy.  Test results from any previous visits will be discussed. Your health care provider may ask you:  How you are feeling.  If you are feeling the baby move.  If you have had any abnormal symptoms, such as leaking fluid, bleeding, severe headaches, or abdominal cramping.  If you are using any tobacco products,   including cigarettes, chewing tobacco, and electronic cigarettes.  If you have any questions. Other tests that may be performed during your first trimester include:  Blood tests to find your blood type and to check for the presence of any previous infections. They will also be used to check for low iron levels (anemia) and Rh antibodies. Later in the pregnancy, blood tests for diabetes will be done along with other tests if problems develop.  Urine tests to check for infections, diabetes, or protein in the urine.  An ultrasound to confirm the proper growth  and development of the baby.  An amniocentesis to check for possible genetic problems.  Fetal screens for spina bifida and Down syndrome.  You may need other tests to make sure you and the baby are doing well.  HIV (human immunodeficiency virus) testing. Routine prenatal testing includes screening for HIV, unless you choose not to have this test. HOME CARE INSTRUCTIONS  Medicines  Follow your health care provider's instructions regarding medicine use. Specific medicines may be either safe or unsafe to take during pregnancy.  Take your prenatal vitamins as directed.  If you develop constipation, try taking a stool softener if your health care provider approves. Diet  Eat regular, well-balanced meals. Choose a variety of foods, such as meat or vegetable-based protein, fish, milk and low-fat dairy products, vegetables, fruits, and whole grain breads and cereals. Your health care provider will help you determine the amount of weight gain that is right for you.  Avoid raw meat and uncooked cheese. These carry germs that can cause birth defects in the baby.  Eating four or five small meals rather than three large meals a day may help relieve nausea and vomiting. If you start to feel nauseous, eating a few soda crackers can be helpful. Drinking liquids between meals instead of during meals also seems to help nausea and vomiting.  If you develop constipation, eat more high-fiber foods, such as fresh vegetables or fruit and whole grains. Drink enough fluids to keep your urine clear or pale yellow. Activity and Exercise  Exercise only as directed by your health care provider. Exercising will help you:  Control your weight.  Stay in shape.  Be prepared for labor and delivery.  Experiencing pain or cramping in the lower abdomen or low back is a good sign that you should stop exercising. Check with your health care provider before continuing normal exercises.  Try to avoid standing for long  periods of time. Move your legs often if you must stand in one place for a long time.  Avoid heavy lifting.  Wear low-heeled shoes, and practice good posture.  You may continue to have sex unless your health care provider directs you otherwise. Relief of Pain or Discomfort  Wear a good support bra for breast tenderness.   Take warm sitz baths to soothe any pain or discomfort caused by hemorrhoids. Use hemorrhoid cream if your health care provider approves.   Rest with your legs elevated if you have leg cramps or low back pain.  If you develop varicose veins in your legs, wear support hose. Elevate your feet for 15 minutes, 3-4 times a day. Limit salt in your diet. Prenatal Care  Schedule your prenatal visits by the twelfth week of pregnancy. They are usually scheduled monthly at first, then more often in the last 2 months before delivery.  Write down your questions. Take them to your prenatal visits.  Keep all your prenatal visits as directed by your   health care provider. Safety  Wear your seat belt at all times when driving.  Make a list of emergency phone numbers, including numbers for family, friends, the hospital, and police and fire departments. General Tips  Ask your health care provider for a referral to a local prenatal education class. Begin classes no later than at the beginning of month 6 of your pregnancy.  Ask for help if you have counseling or nutritional needs during pregnancy. Your health care provider can offer advice or refer you to specialists for help with various needs.  Do not use hot tubs, steam rooms, or saunas.  Do not douche or use tampons or scented sanitary pads.  Do not cross your legs for long periods of time.  Avoid cat litter boxes and soil used by cats. These carry germs that can cause birth defects in the baby and possibly loss of the fetus by miscarriage or stillbirth.  Avoid all smoking, herbs, alcohol, and medicines not prescribed by  your health care provider. Chemicals in these affect the formation and growth of the baby.  Do not use any tobacco products, including cigarettes, chewing tobacco, and electronic cigarettes. If you need help quitting, ask your health care provider. You may receive counseling support and other resources to help you quit.  Schedule a dentist appointment. At home, brush your teeth with a soft toothbrush and be gentle when you floss. SEEK MEDICAL CARE IF:   You have dizziness.  You have mild pelvic cramps, pelvic pressure, or nagging pain in the abdominal area.  You have persistent nausea, vomiting, or diarrhea.  You have a bad smelling vaginal discharge.  You have pain with urination.  You notice increased swelling in your face, hands, legs, or ankles. SEEK IMMEDIATE MEDICAL CARE IF:   You have a fever.  You are leaking fluid from your vagina.  You have spotting or bleeding from your vagina.  You have severe abdominal cramping or pain.  You have rapid weight gain or loss.  You vomit blood or material that looks like coffee grounds.  You are exposed to German measles and have never had them.  You are exposed to fifth disease or chickenpox.  You develop a severe headache.  You have shortness of breath.  You have any kind of trauma, such as from a fall or a car accident.   This information is not intended to replace advice given to you by your health care provider. Make sure you discuss any questions you have with your health care provider.   Document Released: 05/29/2001 Document Revised: 06/25/2014 Document Reviewed: 04/14/2013 Elsevier Interactive Patient Education 2016 Elsevier Inc.  

## 2015-05-11 ENCOUNTER — Encounter: Payer: Self-pay | Admitting: Obstetrics and Gynecology

## 2015-05-17 ENCOUNTER — Emergency Department
Admission: EM | Admit: 2015-05-17 | Discharge: 2015-05-17 | Disposition: A | Discharge status: Home / Self Care | Payer: Medicaid Other | Attending: Emergency Medicine | Admitting: Emergency Medicine

## 2015-05-17 ENCOUNTER — Emergency Department: Payer: Medicaid Other

## 2015-05-17 DIAGNOSIS — Z880 Allergy status to penicillin: Secondary | ICD-10-CM | POA: Diagnosis not present

## 2015-05-17 DIAGNOSIS — I10 Essential (primary) hypertension: Secondary | ICD-10-CM | POA: Diagnosis not present

## 2015-05-17 DIAGNOSIS — Z3A12 12 weeks gestation of pregnancy: Secondary | ICD-10-CM | POA: Diagnosis not present

## 2015-05-17 DIAGNOSIS — N939 Abnormal uterine and vaginal bleeding, unspecified: Secondary | ICD-10-CM

## 2015-05-17 DIAGNOSIS — O209 Hemorrhage in early pregnancy, unspecified: Secondary | ICD-10-CM | POA: Diagnosis present

## 2015-05-17 DIAGNOSIS — Z79899 Other long term (current) drug therapy: Secondary | ICD-10-CM | POA: Diagnosis not present

## 2015-05-17 DIAGNOSIS — N760 Acute vaginitis: Secondary | ICD-10-CM | POA: Insufficient documentation

## 2015-05-17 DIAGNOSIS — O200 Threatened abortion: Secondary | ICD-10-CM | POA: Diagnosis not present

## 2015-05-17 DIAGNOSIS — B9689 Other specified bacterial agents as the cause of diseases classified elsewhere: Secondary | ICD-10-CM

## 2015-05-17 LAB — CHLAMYDIA/NGC RT PCR (ARMC ONLY)
Chlamydia Tr: NOT DETECTED
N gonorrhoeae: NOT DETECTED

## 2015-05-17 LAB — ABO/RH: ABO/RH(D): O POS

## 2015-05-17 LAB — CBC
HCT: 38.7 % (ref 35.0–47.0)
Hemoglobin: 13 g/dL (ref 12.0–16.0)
MCH: 29.4 pg (ref 26.0–34.0)
MCHC: 33.7 g/dL (ref 32.0–36.0)
MCV: 87.3 fL (ref 80.0–100.0)
PLATELETS: 284 10*3/uL (ref 150–440)
RBC: 4.44 MIL/uL (ref 3.80–5.20)
RDW: 14.1 % (ref 11.5–14.5)
WBC: 8.5 10*3/uL (ref 3.6–11.0)

## 2015-05-17 LAB — URINALYSIS COMPLETE WITH MICROSCOPIC (ARMC ONLY)
BILIRUBIN URINE: NEGATIVE
GLUCOSE, UA: NEGATIVE mg/dL
Ketones, ur: NEGATIVE mg/dL
Nitrite: NEGATIVE
PH: 5 (ref 5.0–8.0)
Protein, ur: 30 mg/dL — AB
Specific Gravity, Urine: 1.02 (ref 1.005–1.030)

## 2015-05-17 LAB — WET PREP, GENITAL
Sperm: NONE SEEN
TRICH WET PREP: NONE SEEN
YEAST WET PREP: NONE SEEN

## 2015-05-17 LAB — HCG, QUANTITATIVE, PREGNANCY: HCG, BETA CHAIN, QUANT, S: 72622 m[IU]/mL — AB (ref <5)

## 2015-05-17 MED ORDER — METOCLOPRAMIDE HCL 10 MG PO TABS: 10.0000 mg | ORAL_TABLET | Freq: Three times a day (TID) | ORAL | Status: DC

## 2015-05-17 MED ORDER — METRONIDAZOLE 500 MG PO TABS: 500.0000 mg | ORAL_TABLET | Freq: Two times a day (BID) | ORAL | Status: AC

## 2015-05-17 NOTE — ED Provider Notes (Signed)
[facility] Emergency Department Provider Note  ____________________________________________  Time seen: Approximately 455 AM  I have reviewed the triage vital signs and the nursing notes.   HISTORY  Chief Complaint Vaginal Bleeding    HPI Barbara Hayes is a 29 y.o. female who comes in today with vaginal bleeding. The patient reports that she woke up at 3 AM and there was blood everywhere. She reports that it was like a period. The patient reports that she has seen doctors for this pregnancy and she is [redacted] weeks pregnant. The patient reports that she was doing well earlier in the day with no vaginal bleeding or belly pain but did have some nausea. The patient is a G4 P1 204. The patient reports she is also had some mild pain with urinationand she woke up. The patient has not had this happen in this pregnancy previously. Patient reports that on her first ultrasound she was told that there was a small collection of blood but it was normal. The patient was concerned she decided to come in for evaluation.   Past Medical History  Diagnosis Date  . Migraines   . Hypertension   . IIH (idiopathic intracranial hypertension) 2015    Has had 2 spinal taps to relieve pressure  . Anxiety and depression     Patient Active Problem List   Diagnosis Date Noted  . H/O cesarean section complicating pregnancy 04/12/2015  . History of twin pregnancy in prior pregnancy 04/12/2015    Past Surgical History  Procedure Laterality Date  . Cesarean section      Current Outpatient Rx  Name  Route  Sig  Dispense  Refill  . busPIRone (BUSPAR) 7.5 MG tablet   Oral   Take 7.5 mg by mouth 2 (two) times daily.         Marland Kitchen LORazepam (ATIVAN) 0.5 MG tablet   Oral   Take 0.5 mg by mouth every 8 (eight) hours as needed for anxiety.         . metoCLOPramide (REGLAN) 10 MG tablet   Oral   Take 1 tablet (10 mg total) by mouth 3 (three) times daily with meals.   12 tablet   0    . metroNIDAZOLE (FLAGYL) 500 MG tablet   Oral   Take 1 tablet (500 mg total) by mouth 2 (two) times daily.   14 tablet   0   . Prenatal Vit-Fe Fumarate-FA (MULTIVITAMIN-PRENATAL) 27-0.8 MG TABS tablet   Oral   Take 1 tablet by mouth daily at 12 noon.   30 each   12   . traZODone (DESYREL) 50 MG tablet   Oral   Take 50 mg by mouth at bedtime.         Marland Kitchen venlafaxine XR (EFFEXOR-XR) 75 MG 24 hr capsule   Oral   Take 1 capsule (75 mg total) by mouth daily with breakfast.   30 capsule   6     Allergies Penicillin g and Sulfa antibiotics  Family History  Problem Relation Age of Onset  . Hypertension Father     Social History Social History  Substance Use Topics  . Smoking status: Never Smoker   . Smokeless tobacco: Never Used  . Alcohol Use: No    Review of Systems Constitutional: No fever/chills Eyes: No visual changes. ENT: No sore throat. Cardiovascular: Denies chest pain. Respiratory: Denies shortness of breath. Gastrointestinal: No abdominal pain.  No nausea, no vomiting.  No diarrhea.  No constipation. Genitourinary:  Vaginal bleeding Musculoskeletal: Negative for back pain. Skin: Negative for rash. Neurological: Negative for headaches, focal weakness or numbness.  10-point ROS otherwise negative.  ____________________________________________   PHYSICAL EXAM:  VITAL SIGNS: ED Triage Vitals  Enc Vitals Group     BP 05/17/15 0350 133/91 mmHg     Pulse Rate 05/17/15 0350 92     Resp 05/17/15 0350 18     Temp 05/17/15 0350 97.6 F (36.4 C)     Temp Source 05/17/15 0350 Oral     SpO2 05/17/15 0350 98 %     Weight 05/17/15 0350 290 lb (131.543 kg)     Height 05/17/15 0350 5\' 2"  (1.575 m)     Head Cir --      Peak Flow --      Pain Score --      Pain Loc --      Pain Edu? --      Excl. in GC? --     Constitutional: Alert and oriented. Well appearing and in no acute distress. Eyes: Conjunctivae are normal. PERRL. EOMI. Head:  Atraumatic. Nose: No congestion/rhinnorhea. Mouth/Throat: Mucous membranes are moist.  Oropharynx non-erythematous. Cardiovascular: Normal rate, regular rhythm. Grossly normal heart sounds.  Good peripheral circulation. Respiratory: Normal respiratory effort.  No retractions. Lungs CTAB. Gastrointestinal: Soft and nontender. No distention. Positive bowel sounds Genitourinary: Normal external genitalia, vaginal discharge with no active bleeding, cervix closed with no cervical motion tenderness, no adnexal or uterine tenderness to palpation Musculoskeletal: No lower extremity tenderness nor edema.   Neurologic:  Normal speech and language.  Skin:  Skin is warm, dry and intact.  Psychiatric: Mood and affect are normal.   ____________________________________________   LABS (all labs ordered are listed, but only abnormal results are displayed)  Labs Reviewed  WET PREP, GENITAL - Abnormal; Notable for the following:    Clue Cells Wet Prep HPF POC FEW (*)    WBC, Wet Prep HPF POC MANY (*)    All other components within normal limits  HCG, QUANTITATIVE, PREGNANCY - Abnormal; Notable for the following:    hCG, Beta Chain, Quant, S [id number] (*)    All other components within normal limits  URINALYSIS COMPLETEWITH MICROSCOPIC ([facility] ONLY) - Abnormal; Notable for the following:    Color, Urine AMBER (*)    APPearance HAZY (*)    Hgb urine dipstick 3+ (*)    Protein, ur 30 (*)    Leukocytes, UA TRACE (*)    Bacteria, UA RARE (*)    Squamous Epithelial / LPF 6-30 (*)    All other components within normal limits  CHLAMYDIA/NGC RT PCR ([facility] ONLY)  CBC  ABO/RH   ____________________________________________  EKG  None ____________________________________________  RADIOLOGY  Ultrasound: Single living intrauterine gestation measuring 11 weeks 6 days by crown-rump length, small subchorionic hemorrhage ____________________________________________   PROCEDURES  Procedure(s)  performed: None  Critical Care performed: No  ____________________________________________   INITIAL IMPRESSION / ASSESSMENT AND PLAN / ED COURSE  Pertinent labs & imaging results that were available during my care of the patient were reviewed by me and considered in my medical decision making (see chart for details).  The patient is a [age] year old female who comes in today with vaginal bleeding. The patient is [redacted] weeks pregnant. The patient did have an ultrasound that was normal. At this point I am awaiting the results of her wet prep.  The patient has some clue cells on her wet preps I will give her some metronidazole and  have her follow-up with Dr. Valentino Saxon in the office for further evaluation. The patient this time has no complaints or concerns and no active bleeding. The patient's blood type is O+ confirmed on previous evaluation in the past. ____________________________________________   FINAL CLINICAL IMPRESSION(S) / ED DIAGNOSES  Final diagnoses:  Threatened miscarriage  Bacterial vaginosis      Rebecka Apley, MD 05/17/15 [phone]

## 2015-05-17 NOTE — ED Notes (Signed)
Pt presents to the ED, [redacted] weeks pregnant with vaginal bleeding x1 day. Pt is AOx4 in no apparent distress.

## 2015-05-17 NOTE — ED Notes (Signed)
Patient ambulatory to triage with steady gait, without difficulty or distress noted; pt reports [redacted]wks pregnant with sudden onset vag bleeding PTA; denies any pain; pt at [facility]; G5 P4 (twin pregnancy); [facility]

## 2015-05-17 NOTE — ED Notes (Signed)
Pt went to ultrasound.

## 2015-05-17 NOTE — ED Notes (Signed)
Pt returned from ultrasound

## 2015-05-17 NOTE — Discharge Instructions (Signed)
Bacterial Vaginosis Bacterial vaginosis is a vaginal infection that occurs when the normal balance of bacteria in the vagina is disrupted. It results from an overgrowth of certain bacteria. This is the most common vaginal infection in women of childbearing age. Treatment is important to prevent complications, especially in pregnant women, as it can cause a premature delivery. CAUSES  Bacterial vaginosis is caused by an increase in harmful bacteria that are normally present in smaller amounts in the vagina. Several different kinds of bacteria can cause bacterial vaginosis. However, the reason that the condition develops is not fully understood. RISK FACTORS Certain activities or behaviors can put you at an increased risk of developing bacterial vaginosis, including:  Having a new sex partner or multiple sex partners.  Douching.  Using an intrauterine device (IUD) for contraception. Women do not get bacterial vaginosis from toilet seats, bedding, swimming pools, or contact with objects around them. SIGNS AND SYMPTOMS  Some women with bacterial vaginosis have no signs or symptoms. Common symptoms include:  Grey vaginal discharge.  A fishlike odor with discharge, especially after sexual intercourse.  Itching or burning of the vagina and vulva.  Burning or pain with urination. DIAGNOSIS  Your health care provider will take a medical history and examine the vagina for signs of bacterial vaginosis. A sample of vaginal fluid may be taken. Your health care provider will look at this sample under a microscope to check for bacteria and abnormal cells. A vaginal pH test may also be done.  TREATMENT  Bacterial vaginosis may be treated with antibiotic medicines. These may be given in the form of a pill or a vaginal cream. A second round of antibiotics may be prescribed if the condition comes back after treatment. Because bacterial vaginosis increases your risk for sexually transmitted diseases, getting  treated can help reduce your risk for chlamydia, gonorrhea, HIV, and herpes. HOME CARE INSTRUCTIONS   Only take over-the-counter or prescription medicines as directed by your health care provider.  If antibiotic medicine was prescribed, take it as directed. Make sure you finish it even if you start to feel better.  Tell all sexual partners that you have a vaginal infection. They should see their health care provider and be treated if they have problems, such as a mild rash or itching.  During treatment, it is important that you follow these instructions:  Avoid sexual activity or use condoms correctly.  Do not douche.  Avoid alcohol as directed by your health care provider.  Avoid breastfeeding as directed by your health care provider. SEEK MEDICAL CARE IF:   Your symptoms are not improving after 3 days of treatment.  You have increased discharge or pain.  You have a fever. MAKE SURE YOU:   Understand these instructions.  Will watch your condition.  Will get help right away if you are not doing well or get worse. FOR MORE INFORMATION  Centers for Disease Control and Prevention, Division of STD Prevention: [employer]raud[employer] American Sexual Health Association (ASHA): www.ashastd.org    This information is not intended to replace advice given to you by your health care provider. Make sure you discuss any questions you have with your health care provider.   Document Released: 06/04/2005 Document Revised: 06/25/2014 Document Reviewed: 01/14/2013 Elsevier Interactive Patient Education 2016 [employer].  Threatened Miscarriage A threatened miscarriage occurs when you have vaginal bleeding during your first 20 weeks of pregnancy but the pregnancy has not ended. If you have vaginal bleeding during this time, your health care provider  will do tests to make sure you are still pregnant. If the tests show you are still pregnant and the developing baby (fetus) inside your womb (uterus) is  still growing, your condition is considered a threatened miscarriage. A threatened miscarriage does not mean your pregnancy will end, but it does increase the risk of losing your pregnancy (complete miscarriage). CAUSES  The cause of a threatened miscarriage is usually not known. If you go on to have a complete miscarriage, the most common cause is an abnormal number of chromosomes in the developing baby. Chromosomes are the structures inside cells that hold all your genetic material. Some causes of vaginal bleeding that do not result in miscarriage include:  Having sex.  Having an infection.  Normal hormone changes of pregnancy.  Bleeding that occurs when an egg implants in your uterus. RISK FACTORS Risk factors for bleeding in early pregnancy include:  Obesity.  Smoking.  Drinking excessive amounts of alcohol or caffeine.  Recreational drug use. SIGNS AND SYMPTOMS  Light vaginal bleeding.  Mild abdominal pain or cramps. DIAGNOSIS  If you have bleeding with or without abdominal pain before 20 weeks of pregnancy, your health care provider will do tests to check whether you are still pregnant. One important test involves using sound waves and a computer (ultrasound) to create images of the inside of your uterus. Other tests include an internal exam of your vagina and uterus (pelvic exam) and measurement of your baby's heart rate.  You may be diagnosed with a threatened miscarriage if:  Ultrasound testing shows you are still pregnant.  Your baby's heart rate is strong.  A pelvic exam shows that the opening between your uterus and your vagina (cervix) is closed.  Your heart rate and blood pressure are stable.  Blood tests confirm you are still pregnant. TREATMENT  No treatments have been shown to prevent a threatened miscarriage from going on to a complete miscarriage. However, the right home care is important.  HOME CARE INSTRUCTIONS   Make sure you keep all your  appointments for prenatal care. This is very important.  Get plenty of rest.  Do not have sex or use tampons if you have vaginal bleeding.  Do not douche.  Do not smoke or use recreational drugs.  Do not drink alcohol.  Avoid caffeine. SEEK MEDICAL CARE IF:  You have light vaginal bleeding or spotting while pregnant.  You have abdominal pain or cramping.  You have a fever. SEEK IMMEDIATE MEDICAL CARE IF:  You have heavy vaginal bleeding.  You have blood clots coming from your vagina.  You have severe low back pain or abdominal cramps.  You have fever, chills, and severe abdominal pain. MAKE SURE YOU:  Understand these instructions.  Will watch your condition.  Will get help right away if you are not doing well or get worse.   This information is not intended to replace advice given to you by your health care provider. Make sure you discuss any questions you have with your health care provider.   Document Released: 06/04/2005 Document Revised: 06/09/2013 Document Reviewed: 03/31/2013 Elsevier Interactive Patient Education [employer] Elsevier Inc.

## 2015-05-18 ENCOUNTER — Ambulatory Visit (INDEPENDENT_AMBULATORY_CARE_PROVIDER_SITE_OTHER): Payer: Medicaid Other | Admitting: Obstetrics and Gynecology

## 2015-05-18 ENCOUNTER — Ambulatory Visit: Payer: Medicaid Other

## 2015-05-18 VITALS — BP 133/82 | HR 76 | Wt 283.3 lb

## 2015-05-18 DIAGNOSIS — O43891 Other placental disorders, first trimester: Secondary | ICD-10-CM

## 2015-05-18 DIAGNOSIS — O468X1 Other antepartum hemorrhage, first trimester: Secondary | ICD-10-CM

## 2015-05-18 DIAGNOSIS — O4691 Antepartum hemorrhage, unspecified, first trimester: Secondary | ICD-10-CM | POA: Diagnosis not present

## 2015-05-18 DIAGNOSIS — O209 Hemorrhage in early pregnancy, unspecified: Secondary | ICD-10-CM

## 2015-05-18 DIAGNOSIS — O418X10 Other specified disorders of amniotic fluid and membranes, first trimester, not applicable or unspecified: Secondary | ICD-10-CM

## 2015-05-18 LAB — POCT URINALYSIS DIPSTICK
Bilirubin, UA: NEGATIVE
Glucose, UA: NEGATIVE
Ketones, UA: NEGATIVE
Nitrite, UA: NEGATIVE
PH UA: 6
PROTEIN UA: NEGATIVE
SPEC GRAV UA: 1.02
UROBILINOGEN UA: 1

## 2015-05-18 NOTE — Progress Notes (Signed)
OBSTETRICS PROBLEM VISIT CLINIC NOTE  Subjective:    Barbara Hayes is a 29 y.o. [id number] female at 856w5d gestation, with Estimated Date of Delivery: 12/02/15. [name] reports vaginal bleeding since 2-3 days ago. She is not in acute distress. She is following up from recent ER visit for vaginal bleeding where she was diagnosed with a subchorionic hemorrhage and BV infection.  Has been attempting to take the medication but notes it makes her nauseated. Denies any vaginal bleeding today.   Pregnancy imaging: transvaginal ultrasound done on 05/17/15, performed in ER. Result small subchorionic hemorrhage, viable IUP at [redacted] weeks gestation. Blood type: O positive. Other lab results: vaginal wet rep in ER, noted clue cells.  The following portions of the patient's history were reviewed and updated as appropriate: allergies, current medications, past family history, past medical history, past social history, past surgical history and problem list.  Review of Systems Pertinent items noted in HPI and remainder of comprehensive ROS otherwise negative.   Objective:     BP 133/82 mmHg  Pulse 76  Wt 283 lb 4.8 oz (128.504 kg)  LMP 02/25/2015 (Approximate) General:   alert, no distress and morbidly obese  Heart: regular rate and rhythm, S1, S2 normal, no murmur, click, rub or gallop  Lungs: clear to auscultation bilaterally  Abdomen: soft, non-tender, without masses or organomegaly.  Unable to obtain FHR on dopplers due to body habitus.   Pelvic: External genitalia: normal general appearance Urinary system: urethral meatus normal and exam limited by body habitus Vaginal: normal mucosa without prolapse or lesions and discharge, white Cervix: normal appearance and multiparous, closed Adnexa: normal bimanual exam Uterus: normal single, nontender and enlarged          Extremities: Normal without tenderness, erythema, edema          Neurologic:  Grossly normal   Imaging 05/17/2015 Pelvic Ultrasound -   FINDINGS: Intrauterine gestational sac: Single Yolk sac: Yes Embryo: Yes Cardiac Activity: Yes Heart Rate: 153 bpm MSD: mm w d CRL: 52.4 mm 11 w 6 d US EDC: 11/30/2015  Maternal uterus/adnexae: Small subchorionic hemorrhage. Maternal ovaries not visualized, but no adnexal abnormalities are evident.  IMPRESSION: Single living intrauterine gestation measuring 11 weeks 6 days by crown-rump length. Small subchorionic hemorrhage.  05/18/2015 Limited office ultrasound -    Single living intrauterine gestation measuring 12 weeks 0 days by crown-rump length. Small subchorionic hemorrhage. FHR 163 bpm.  Assessment:    Bleeding in early pregnancy, subchorionic hemorrhage IUP at 806w5d weeks gestation   Plan:    Offered reassurance, advised that most subchorionic hemorrhages resolve by the end of the 1st trimester.  Given bleeding precautions.     To f/u for regularly scheduled OB appointment in ~3 weeks.    Hildred LaserAnika Antone Summons, MD [facility]

## 2015-05-19 ENCOUNTER — Encounter: Payer: Self-pay | Admitting: Obstetrics and Gynecology

## 2015-05-20 ENCOUNTER — Telehealth: Payer: Self-pay | Admitting: *Deleted

## 2015-05-20 ENCOUNTER — Telehealth: Payer: Self-pay

## 2015-05-20 NOTE — Telephone Encounter (Signed)
Received email from [employer] rep, opened chart on accident, pt had already been notified of re-draw

## 2015-05-20 NOTE — Telephone Encounter (Signed)
-----   Message from Hildred LaserAnika Cherry, MD sent at 05/19/2015  4:29 PM EST ----- Please inform patient that there were not enough fetal cells iin her blood at the time, so the Panorama test came back invalid.  Can repeat at no additional cost to patient now that she is further along and should have an adequate sample of cells.

## 2015-05-20 NOTE — Telephone Encounter (Signed)
Called pt informed of the need to repeat Panorama. Pt gave verbal understanding. Informed pt of available lab hours.

## 2015-05-21 ENCOUNTER — Emergency Department
Admission: EM | Admit: 2015-05-21 | Discharge: 2015-05-21 | Disposition: A | Discharge status: Home / Self Care | Payer: Medicaid Other | Attending: Emergency Medicine | Admitting: Emergency Medicine

## 2015-05-21 ENCOUNTER — Emergency Department: Payer: Medicaid Other

## 2015-05-21 ENCOUNTER — Encounter: Payer: Self-pay | Admitting: *Deleted

## 2015-05-21 DIAGNOSIS — O30001 Twin pregnancy, unspecified number of placenta and unspecified number of amniotic sacs, first trimester: Secondary | ICD-10-CM | POA: Diagnosis not present

## 2015-05-21 DIAGNOSIS — Z880 Allergy status to penicillin: Secondary | ICD-10-CM | POA: Diagnosis not present

## 2015-05-21 DIAGNOSIS — Z3A12 12 weeks gestation of pregnancy: Secondary | ICD-10-CM | POA: Insufficient documentation

## 2015-05-21 DIAGNOSIS — Z79899 Other long term (current) drug therapy: Secondary | ICD-10-CM | POA: Insufficient documentation

## 2015-05-21 DIAGNOSIS — O209 Hemorrhage in early pregnancy, unspecified: Secondary | ICD-10-CM | POA: Diagnosis not present

## 2015-05-21 DIAGNOSIS — O10011 Pre-existing essential hypertension complicating pregnancy, first trimester: Secondary | ICD-10-CM | POA: Diagnosis not present

## 2015-05-21 DIAGNOSIS — N939 Abnormal uterine and vaginal bleeding, unspecified: Secondary | ICD-10-CM

## 2015-05-21 LAB — CBC WITH DIFFERENTIAL/PLATELET
BASOS PCT: 1 %
Basophils Absolute: 0.1 10*3/uL (ref 0–0.1)
EOS ABS: 0.1 10*3/uL (ref 0–0.7)
Eosinophils Relative: 1 %
HEMATOCRIT: 39.8 % (ref 35.0–47.0)
HEMOGLOBIN: 13.4 g/dL (ref 12.0–16.0)
LYMPHS ABS: 2.3 10*3/uL (ref 1.0–3.6)
Lymphocytes Relative: 21 %
MCH: 29.6 pg (ref 26.0–34.0)
MCHC: 33.6 g/dL (ref 32.0–36.0)
MCV: 88.1 fL (ref 80.0–100.0)
MONOS PCT: 6 %
Monocytes Absolute: 0.7 10*3/uL (ref 0.2–0.9)
NEUTROS ABS: 8.1 10*3/uL — AB (ref 1.4–6.5)
NEUTROS PCT: 71 %
Platelets: 284 10*3/uL (ref 150–440)
RBC: 4.51 MIL/uL (ref 3.80–5.20)
RDW: 14 % (ref 11.5–14.5)
WBC: 11.2 10*3/uL — AB (ref 3.6–11.0)

## 2015-05-21 LAB — COMPREHENSIVE METABOLIC PANEL
ALBUMIN: 3.6 g/dL (ref 3.5–5.0)
ALK PHOS: 101 U/L (ref 38–126)
ALT: 21 U/L (ref 14–54)
AST: 18 U/L (ref 15–41)
Anion gap: 8 (ref 5–15)
BUN: 7 mg/dL (ref 6–20)
CALCIUM: 9.6 mg/dL (ref 8.9–10.3)
CO2: 25 mmol/L (ref 22–32)
CREATININE: 0.5 mg/dL (ref 0.44–1.00)
Chloride: 105 mmol/L (ref 101–111)
GFR calc Af Amer: >60 mL/min (ref >60)
GFR calc non Af Amer: >60 mL/min (ref >60)
GLUCOSE: 82 mg/dL (ref 65–99)
Potassium: 3.9 mmol/L (ref 3.5–5.1)
SODIUM: 138 mmol/L (ref 135–145)
TOTAL PROTEIN: 7.4 g/dL (ref 6.5–8.1)

## 2015-05-21 LAB — HCG, QUANTITATIVE, PREGNANCY: hCG, Beta Chain, Quant, S: 80094 m[IU]/mL — ABNORMAL HIGH (ref <5)

## 2015-05-21 NOTE — ED Notes (Signed)
Patient states she was sitting in a chair and felt an "air bubble pop" and stood up and was "pouring out blood." Patient states she has 4 towels and two washcloths to deal with the blood. Patient states she is still bleeding.

## 2015-05-21 NOTE — Discharge Instructions (Signed)
Your ultrasound looked okay. The subchorionic hemorrhages the same size. Follow-up with Dr. Valentino Saxonherry early this coming week. This was discussed with Dr. Greggory Keenefrancesco. Return to the emergency department if you have repeat episodes of heavy bleeding, more cramping, or other urgent concerns.  Vaginal Bleeding During Pregnancy, First Trimester A small amount of bleeding (spotting) from the vagina is common in early pregnancy. Sometimes the bleeding is normal and is not a problem, and sometimes it is a sign of something serious. Be sure to tell your doctor about any bleeding from your vagina right away. HOME CARE  Watch your condition for any changes.  Follow your doctor's instructions about how active you can be.  If you are on bed rest:  You may need to stay in bed and only get up to use the bathroom.  You may be allowed to do some activities.  If you need help, make plans for someone to help you.  Write down:  The number of pads you use each day.  How often you change pads.  How soaked (saturated) your pads are.  Do not use tampons.  Do not douche.  Do not have sex or orgasms until your doctor says it is okay.  If you pass any tissue from your vagina, save the tissue so you can show it to your doctor.  Only take medicines as told by your doctor.  Do not take aspirin because it can make you bleed.  Keep all follow-up visits as told by your doctor. GET HELP IF:   You bleed from your vagina.  You have cramps.  You have labor pains.  You have a fever that does not go away after you take medicine. GET HELP RIGHT AWAY IF:   You have very bad cramps in your back or belly (abdomen).  You pass large clots or tissue from your vagina.  You bleed more.  You feel light-headed or weak.  You pass out (faint).  You have chills.  You are leaking fluid or have a gush of fluid from your vagina.  You pass out while pooping (having a bowel movement). MAKE SURE  YOU:  Understand these instructions.  Will watch your condition.  Will get help right away if you are not doing well or get worse.   This information is not intended to replace advice given to you by your health care provider. Make sure you discuss any questions you have with your health care provider.   Document Released: 10/19/2013 Document Reviewed: 10/19/2013 Elsevier Interactive Patient Education [employer]2016 Elsevier Inc.

## 2015-05-21 NOTE — ED Provider Notes (Signed)
[facility] Emergency Department Provider Note  ____________________________________________  Time seen: 1440  I have reviewed the triage vital signs and the nursing notes.  History by:  Patient and family   HISTORY  Chief Complaint Vaginal Bleeding     HPI Barbara Hayes is a 29 y.o. female, G4, A0, P4, with one set of twins and currently pregnant at approximately 12 weeks. She has been having some bleeding and was seen 3 days going the emergency department. She was diagnosed with a small subchorionic hemorrhage. She reports the bleeding had stopped and was doing well until approximately 12:30 PM today. She felt an odd sensation, almost like an air bubble popping she says, and then had bleeding vaginally. She reports having some cramping on the sides of her lower abdomen. She continues to pass blood clots in the emergency department.  The patient has been seen at in [facility], Dr. Valentino Saxonherry, as well as the emergency department as noted above.   Past Medical History  Diagnosis Date  . Migraines   . Hypertension   . IIH (idiopathic intracranial hypertension) 2015    Has had 2 spinal taps to relieve pressure  . Anxiety and depression     Patient Active Problem List   Diagnosis Date Noted  . H/O cesarean section complicating pregnancy 04/12/2015  . History of twin pregnancy in prior pregnancy 04/12/2015    Past Surgical History  Procedure Laterality Date  . Cesarean section      Current Outpatient Rx  Name  Route  Sig  Dispense  Refill  . busPIRone (BUSPAR) 7.5 MG tablet   Oral   Take 7.5 mg by mouth 2 (two) times daily.         Marland Kitchen. LORazepam (ATIVAN) 0.5 MG tablet   Oral   Take 0.5 mg by mouth every 8 (eight) hours as needed for anxiety.         . metoCLOPramide (REGLAN) 10 MG tablet   Oral   Take 1 tablet (10 mg total) by mouth 3 (three) times daily with meals.   12 tablet   0   . metroNIDAZOLE (FLAGYL) 500 MG tablet   Oral   Take  1 tablet (500 mg total) by mouth 2 (two) times daily.   14 tablet   0   . Prenatal Vit-Fe Fumarate-FA (MULTIVITAMIN-PRENATAL) 27-0.8 MG TABS tablet   Oral   Take 1 tablet by mouth daily at 12 noon.   30 each   12   . traZODone (DESYREL) 50 MG tablet   Oral   Take 50 mg by mouth at bedtime.         Marland Kitchen. venlafaxine XR (EFFEXOR-XR) 75 MG 24 hr capsule   Oral   Take 1 capsule (75 mg total) by mouth daily with breakfast.   30 capsule   6     Allergies Penicillin g and Sulfa antibiotics  Family History  Problem Relation Age of Onset  . Hypertension Father     Social History Social History  Substance Use Topics  . Smoking status: Never Smoker   . Smokeless tobacco: Never Used  . Alcohol Use: No    Review of Systems  Constitutional: Negative for fever/chills. ENT: Negative for congestion. Cardiovascular: Negative for chest pain. Respiratory: Negative for cough. Gastrointestinal: Negative for abdominal pain, vomiting and diarrhea. Genitourinary: [redacted] weeks pregnant with vaginal bleeding. See history of present illness Musculoskeletal: No back pain. Skin: Negative for rash. Neurological: Negative for headache or focal weakness.  Patient does have a history of intracranial hypertension.   10-point ROS otherwise negative.  ____________________________________________   PHYSICAL EXAM:  VITAL SIGNS: ED Triage Vitals  Enc Vitals Group     BP 05/21/15 1358 135/85 mmHg     Pulse Rate 05/21/15 1358 85     Resp 05/21/15 1358 18     Temp 05/21/15 1358 98.5 F (36.9 C)     Temp Source 05/21/15 1358 Oral     SpO2 05/21/15 1358 100 %     Weight 05/21/15 1358 283 lb (128.368 kg)     Height 05/21/15 1358  (1.575 m)     Head Cir --      Peak Flow --      Pain Score 05/21/15 1401 4     Pain Loc --      Pain Edu? --      Excl. in GC? --     Constitutional:  Alert and oriented. Well appearing and in no distress. ENT   Head: Normocephalic and atraumatic.    Nose: No congestion/rhinnorhea.  Cardiovascular: Normal rate, regular rhythm, no murmur noted Respiratory:  Normal respiratory effort, no tachypnea.    Breath sounds are clear and equal bilaterally.  Gastrointestinal: Soft, no distention. Mild tenderness in the right lower quadrant. Back: No muscle spasm, no tenderness, no CVA tenderness. Musculoskeletal: No deformity noted. Nontender with normal range of motion in all extremities.  No noted edema. Neurologic:  Communicative. Normal appearing spontaneous movement in all 4 extremities. No gross focal neurologic deficits are appreciated.  Skin:  Skin is warm, dry. No rash noted. Psychiatric: Mood and affect are normal. Speech and behavior are normal.  ____________________________________________    LABS (pertinent positives/negatives)  Labs Reviewed  HCG, QUANTITATIVE, PREGNANCY - Abnormal; Notable for the following:    hCG, Beta Chain, Quant, S 16109 (*)    All other components within normal limits  CBC WITH DIFFERENTIAL/PLATELET - Abnormal; Notable for the following:    WBC 11.2 (*)    Neutro Abs 8.1 (*)    All other components within normal limits  COMPREHENSIVE METABOLIC PANEL - Abnormal; Notable for the following:    Total Bilirubin <0.1 (*)    All other components within normal limits     ____________________________________________  RADIOLOGY  Pelvic ultrasound: IMPRESSION: 1. Subchorionic hematoma, 31 x 9 x 17 mm, without visible growth compared to 05/17/2015 study. 2. Normal fetal heart rate.  ____________________________________________   PROCEDURES    ____________________________________________   INITIAL IMPRESSION / ASSESSMENT AND PLAN / ED COURSE  Pertinent labs & imaging results that were available during my care of the patient were reviewed by me and considered in my medical decision making (see chart for details).  29 year old female at 12 weeks of pregnancy. She is passing blood and clots, though  this appears to primarily be small dark clots here. There has been no tissue consistent with products of conception. I suspect the bleeding is subsequent to her recently diagnosed small subchorionic hemorrhage. We will obtain an ultrasound to reassess the subchorionic hemorrhage and pregnancy.  ----------------------------------------- 4:36 PM on 05/21/2015 -----------------------------------------  Patient's ultrasound shows no change to the subchorionic hemorrhage.  I've spoken with Dr. Greggory Keen. He agrees with discharge and will arrange to have the patient seen in encompasses office.  ____________________________________________   FINAL CLINICAL IMPRESSION(S) / ED DIAGNOSES  Final diagnoses:  Vaginal bleeding in pregnancy, first trimester      Darien Ramus, MD 05/21/15 1644

## 2015-05-24 ENCOUNTER — Ambulatory Visit (INDEPENDENT_AMBULATORY_CARE_PROVIDER_SITE_OTHER): Payer: Medicaid Other | Admitting: Obstetrics and Gynecology

## 2015-05-24 ENCOUNTER — Encounter: Payer: Self-pay | Admitting: Obstetrics and Gynecology

## 2015-05-24 VITALS — BP 141/102 | HR 88 | Ht 62.0 in | Wt 282.4 lb

## 2015-05-24 DIAGNOSIS — Z3490 Encounter for supervision of normal pregnancy, unspecified, unspecified trimester: Secondary | ICD-10-CM

## 2015-05-24 DIAGNOSIS — N898 Other specified noninflammatory disorders of vagina: Secondary | ICD-10-CM | POA: Diagnosis not present

## 2015-05-24 DIAGNOSIS — O219 Vomiting of pregnancy, unspecified: Secondary | ICD-10-CM | POA: Diagnosis not present

## 2015-05-24 DIAGNOSIS — F329 Major depressive disorder, single episode, unspecified: Secondary | ICD-10-CM | POA: Insufficient documentation

## 2015-05-24 DIAGNOSIS — O468X1 Other antepartum hemorrhage, first trimester: Secondary | ICD-10-CM

## 2015-05-24 DIAGNOSIS — O43891 Other placental disorders, first trimester: Secondary | ICD-10-CM | POA: Diagnosis not present

## 2015-05-24 DIAGNOSIS — N939 Abnormal uterine and vaginal bleeding, unspecified: Secondary | ICD-10-CM

## 2015-05-24 DIAGNOSIS — Z87442 Personal history of urinary calculi: Secondary | ICD-10-CM | POA: Insufficient documentation

## 2015-05-24 DIAGNOSIS — Z8742 Personal history of other diseases of the female genital tract: Secondary | ICD-10-CM | POA: Insufficient documentation

## 2015-05-24 DIAGNOSIS — Z331 Pregnant state, incidental: Secondary | ICD-10-CM

## 2015-05-24 DIAGNOSIS — F419 Anxiety disorder, unspecified: Secondary | ICD-10-CM

## 2015-05-24 DIAGNOSIS — O418X10 Other specified disorders of amniotic fluid and membranes, first trimester, not applicable or unspecified: Secondary | ICD-10-CM

## 2015-05-24 LAB — POCT URINALYSIS DIPSTICK
BILIRUBIN UA: NEGATIVE
GLUCOSE UA: NEGATIVE
Ketones, UA: NEGATIVE
LEUKOCYTES UA: NEGATIVE
NITRITE UA: NEGATIVE
PH UA: 6
RBC UA: NEGATIVE
Spec Grav, UA: 1.025
UROBILINOGEN UA: NEGATIVE

## 2015-05-24 MED ORDER — ONDANSETRON 4 MG PO TBDP: 4.0000 mg | ORAL_TABLET | Freq: Four times a day (QID) | ORAL | Status: DC | PRN

## 2015-05-24 NOTE — Patient Instructions (Signed)
1.  Fetal heart tones are heard today.  Fetal viability is verified and patient is given reassurance.  There is a 10% chance this carries Her given that she does have fetal heart tones with vaginal bleeding in the first trimester. 2.  Patient is O+ blood type.  She does not need RhoGAM. 3.  Zofran prescription is given for nausea as Unisom/vitamin B6 are ineffective. 4.  Keep OB appointment as scheduled on 06/07/2015. 5.  Return as needed if bright red bleeding recurs or if pelvic cramping increases.

## 2015-05-24 NOTE — Progress Notes (Signed)
GYN ENCOUNTER NOTE  Subjective:       Barbara Hayes is a 29 y.o. [phone] female is here for gynecologic evaluation of the following issues:  1. First trimester bleeding.  ER follow-up. 2.  O+ blood type.  The patient is a G4, para 1204. G1, SVD, term, G2 preterm twins, A vaginal, B, cesarean section G3 SVD, preterm (36.6 weeks) G4, current pregnancy; ultrasound demonstrates subchorionic hematoma, non-enlarging on serial ultrasounds  Gynecologic History Patient's last menstrual period was 02/25/2015 (approximate).   Obstetric History OB History  Gravida Para Term Preterm AB SAB TAB Ectopic Multiple Living  # Outcome Date GA Lbr Len/2nd Weight Sex Delivery Anes PTL Lv  5 Current           4 Preterm 2012 [redacted]w[redacted]d  6 lb 6 oz (2.892 kg) F VBAC   Y  3A Preterm 2010 [redacted]w[redacted]d  5 lb (2.268 kg) F Vag-Spont   Y  3B Preterm 2010 [redacted]w[redacted]d  4 lb 6 oz (1.984 kg) F CS-Unspec   Y  2 Term 2008 [redacted]w[redacted]d  7 lb 9 oz (3.43 kg) M Vag-Spont   Y  1 Para             Obstetric Comments  G1: Delivery problem: vacum needed, tore, then cut and was stitched from inside out; put to sleep to do the surgery and had blood transfusion.  G2: Gestational HTN requiring medications until 6 weeks postpartum.  Twin pregnancy    Past Medical History  Diagnosis Date  . Migraines   . Hypertension   . IIH (idiopathic intracranial hypertension) 2015    Has had 2 spinal taps to relieve pressure  . Anxiety and depression     Past Surgical History  Procedure Laterality Date  . Cesarean section      Current Outpatient Prescriptions on File Prior to Visit  Medication Sig Dispense Refill  . busPIRone (BUSPAR) 7.5 MG tablet Take 7.5 mg by mouth 2 (two) times daily.    Marland Kitchen LORazepam (ATIVAN) 0.5 MG tablet Take 0.5 mg by mouth every 8 (eight) hours as needed for anxiety.    . metoCLOPramide (REGLAN) 10 MG tablet Take 1 tablet (10 mg total) by mouth 3 (three) times daily with meals. 12 tablet 0  .  metroNIDAZOLE (FLAGYL) 500 MG tablet Take 1 tablet (500 mg total) by mouth 2 (two) times daily. 14 tablet 0  . Prenatal Vit-Fe Fumarate-FA (MULTIVITAMIN-PRENATAL) 27-0.8 MG TABS tablet Take 1 tablet by mouth daily at 12 noon. 30 each 12  . traZODone (DESYREL) 50 MG tablet Take 50 mg by mouth at bedtime.    Marland Kitchen venlafaxine XR (EFFEXOR-XR) 75 MG 24 hr capsule Take 1 capsule (75 mg total) by mouth daily with breakfast. 30 capsule 6   No current facility-administered medications on file prior to visit.    Allergies  Allergen Reactions  . Penicillin G Anaphylaxis  . Sulfa Antibiotics Anaphylaxis    Social History   Social History  . Marital Status: Married    Spouse Name: N/A  . Number of Children: N/A  . Years of Education: N/A   Occupational History  . Not on file.   Social History Main Topics  . Smoking status: Never Smoker   . Smokeless tobacco: Never Used  . Alcohol Use: No  . Drug Use: No  . Sexual Activity:    Partners: Male   Other Topics Concern  .  Not on file   Social History Narrative    Family History  Problem Relation Age of Onset  . Hypertension Father     The following portions of the patient's history were reviewed and updated as appropriate: allergies, current medications, past family history, past medical history, past social history, past surgical history and problem list.  Review of Systems Review of Systems - General ROS: negative for - chills, fatigue, fever, hot flashes, malaise or night sweats Hematological and Lymphatic ROS: negative for - bleeding problems or swollen lymph nodes Gastrointestinal ROS: negative for - abdominal pain, blood in stools, change in bowel habits and nausea/vomiting Musculoskeletal ROS: negative for - joint pain, muscle pain or muscular weakness Genito-Urinary ROS: negative for - change in menstrual cycle, dysmenorrhea, dyspareunia, dysuria, genital discharge, genital ulcers, hematuria, incontinence, irregular/heavy menses,  nocturia or pelvic pain;POSITIVE-brownish discharge; no bright red bleeding  Objective:   BP 141/102 mmHg  Pulse 88  Ht 5\' 2"  (1.575 m)  Wt 282 lb 7 oz (128.113 kg)  BMI 51.65 kg/m2  LMP 02/25/2015 (Approximate) CONSTITUTIONAL: Well-developed, well-nourished female in no acute distress.  HENT:  Normocephalic, atraumatic.  NECK: Normal range of motion, supple, no masses.  Normal thyroid.  SKIN: Skin is warm and dry. No rash noted. Not diaphoretic. No erythema. No pallor. NEUROLGIC: Alert and oriented to person, place, and time. PSYCHIATRIC: Normal mood and affect. Normal behavior. Normal judgment and thought content. CARDIOVASCULAR:Not Examined RESPIRATORY: Not Examined BREASTS: Not Examined ABDOMEN: Soft, non distended; Non tender.  No Organomegaly. PELVIC:  External Genitalia: Normal  BUS: Normal  Vagina: Normal; brown blood in vault; no bright red bleeding  Cervix: Normal; closed; no cervical motion tenderness  Uterus: enlarged, size difficult to assess due to body habitus  Adnexa: Normal  RV: Normal external exam  Bladder: Nontender MUSCULOSKELETAL: Normal range of motion. No tenderness.  No cyanosis, clubbing, or edema.     Assessment:   1. Pregnancy; 12 weeks with first trimester bleeding and ultrasound demonstrating subchorionic hematoma, not enlarging  on 2  Serial ultrasounds; fetal viability audible with Doptone today. - POCT urinalysis dipstick 2.  Nausea, unresolved with Unisom/vitamin B6 3.  Morbid obesity. 4.  Prior C-section. 5.  Anxiety/depression     Plan:   1.  Threatened AB precautions given.  Follow-up as needed.  If bright red bleeding recurs or if pelvic cramping increases. 2.  Keep her new OB appointment as scheduled on 06/07/2015. 3.  Zofran prescription for nausea.  Herold HarmsMartin A Chalmers Iddings, MD  Note: This dictation was prepared with Dragon dictation along with smaller phrase technology. Any transcriptional errors that result from this process  are unintentional.

## 2015-06-03 ENCOUNTER — Telehealth: Payer: Self-pay

## 2015-06-03 NOTE — Telephone Encounter (Signed)
Called pt informed her of negative Panorama results and female sex. Pt had called twice for results, informed pt that we did not receive results until today after calling [employer] for results.

## 2015-06-07 ENCOUNTER — Encounter: Payer: Self-pay | Admitting: Obstetrics and Gynecology

## 2015-06-07 ENCOUNTER — Ambulatory Visit (INDEPENDENT_AMBULATORY_CARE_PROVIDER_SITE_OTHER): Payer: Medicaid Other | Admitting: Obstetrics and Gynecology

## 2015-06-07 ENCOUNTER — Other Ambulatory Visit: Payer: Self-pay

## 2015-06-07 VITALS — BP 121/81 | HR 89 | Wt 283.3 lb

## 2015-06-07 DIAGNOSIS — E669 Obesity, unspecified: Secondary | ICD-10-CM

## 2015-06-07 DIAGNOSIS — O418X10 Other specified disorders of amniotic fluid and membranes, first trimester, not applicable or unspecified: Secondary | ICD-10-CM

## 2015-06-07 DIAGNOSIS — Z8679 Personal history of other diseases of the circulatory system: Secondary | ICD-10-CM

## 2015-06-07 DIAGNOSIS — O468X1 Other antepartum hemorrhage, first trimester: Secondary | ICD-10-CM

## 2015-06-07 DIAGNOSIS — O09219 Supervision of pregnancy with history of pre-term labor, unspecified trimester: Secondary | ICD-10-CM

## 2015-06-07 DIAGNOSIS — R7309 Other abnormal glucose: Secondary | ICD-10-CM

## 2015-06-07 DIAGNOSIS — Z6841 Body Mass Index (BMI) 40.0 and over, adult: Principal | ICD-10-CM

## 2015-06-07 DIAGNOSIS — Z3492 Encounter for supervision of normal pregnancy, unspecified, second trimester: Secondary | ICD-10-CM

## 2015-06-07 DIAGNOSIS — O43891 Other placental disorders, first trimester: Secondary | ICD-10-CM

## 2015-06-07 DIAGNOSIS — O09899 Supervision of other high risk pregnancies, unspecified trimester: Secondary | ICD-10-CM | POA: Insufficient documentation

## 2015-06-07 LAB — POCT URINALYSIS DIPSTICK
BILIRUBIN UA: 1
GLUCOSE UA: NEGATIVE
Ketones, UA: NEGATIVE
Leukocytes, UA: NEGATIVE
Nitrite, UA: NEGATIVE
SPEC GRAV UA: 1.025
Urobilinogen, UA: 0.2
pH, UA: 6

## 2015-06-07 NOTE — Progress Notes (Signed)
Early gtt and baseline pih labs- vaginal spotting only - old blood. No Doptone today.  Ultrasound Demonstrates-Fetal heart rate 160

## 2015-06-07 NOTE — Addendum Note (Signed)
Addended by: Marchelle FolksMILLER, Zoriana Oats G on: 06/07/2015 11:13 AM   Modules accepted: Orders

## 2015-06-08 LAB — URINE CULTURE

## 2015-06-08 LAB — CBC WITH DIFFERENTIAL/PLATELET
BASOS: 0 %
Basophils Absolute: 0 10*3/uL (ref 0.0–0.2)
EOS (ABSOLUTE): 0.1 10*3/uL (ref 0.0–0.4)
EOS: 1 %
HEMATOCRIT: 36 % (ref 34.0–46.6)
HEMOGLOBIN: 12.4 g/dL (ref 11.1–15.9)
IMMATURE GRANS (ABS): 0 10*3/uL (ref 0.0–0.1)
Immature Granulocytes: 0 %
LYMPHS ABS: 2 10*3/uL (ref 0.7–3.1)
LYMPHS: 25 %
MCH: 29.9 pg (ref 26.6–33.0)
MCHC: 34.4 g/dL (ref 31.5–35.7)
MCV: 87 fL (ref 79–97)
MONOCYTES: 5 %
Monocytes Absolute: 0.4 10*3/uL (ref 0.1–0.9)
NEUTROS ABS: 5.4 10*3/uL (ref 1.4–7.0)
Neutrophils: 69 %
Platelets: 288 10*3/uL (ref 150–379)
RBC: 4.15 x10E6/uL (ref 3.77–5.28)
RDW: 14.8 % (ref 12.3–15.4)
WBC: 7.9 10*3/uL (ref 3.4–10.8)

## 2015-06-08 LAB — HEPATIC FUNCTION PANEL
ALK PHOS: 107 IU/L (ref 39–117)
ALT: 11 IU/L (ref 0–32)
AST: 13 IU/L (ref 0–40)
Bilirubin Total: 0.2 mg/dL (ref 0.0–1.2)
Bilirubin, Direct: 0.08 mg/dL (ref 0.00–0.40)
TOTAL PROTEIN: 6.3 g/dL (ref 6.0–8.5)

## 2015-06-08 LAB — PROTEIN / CREATININE RATIO, URINE
Creatinine, Urine: 235 mg/dL
PROTEIN UR: 24.8 mg/dL
PROTEIN/CREAT RATIO: 106 mg/g{creat} (ref 0–200)

## 2015-06-08 LAB — RENAL FUNCTION PANEL
Albumin: 3.7 g/dL (ref 3.5–5.5)
BUN / CREAT RATIO: 11 (ref 8–20)
BUN: 5 mg/dL — AB (ref 6–20)
CALCIUM: 8.9 mg/dL (ref 8.7–10.2)
CO2: 20 mmol/L (ref 18–29)
CREATININE: 0.44 mg/dL — AB (ref 0.57–1.00)
Chloride: 100 mmol/L (ref 96–106)
GFR calc Af Amer: 159 mL/min/{1.73_m2} (ref >59)
GFR, EST NON AFRICAN AMERICAN: 138 mL/min/{1.73_m2} (ref >59)
Glucose: 142 mg/dL — ABNORMAL HIGH (ref 65–99)
PHOSPHORUS: 3.4 mg/dL (ref 2.5–4.5)
Potassium: 3.6 mmol/L (ref 3.5–5.2)
SODIUM: 137 mmol/L (ref 134–144)

## 2015-06-08 LAB — URIC ACID: URIC ACID: 4.1 mg/dL (ref 2.5–7.1)

## 2015-06-08 LAB — MICROALBUMIN / CREATININE URINE RATIO
MICROALB/CREAT RATIO: 7 mg/g creat (ref 0.0–30.0)
MICROALBUM., U, RANDOM: 16.4 ug/mL

## 2015-06-08 LAB — GLUCOSE, 1 HOUR GESTATIONAL: GESTATIONAL DIABETES SCREEN: 136 mg/dL (ref 65–139)

## 2015-07-05 ENCOUNTER — Other Ambulatory Visit: Payer: Self-pay

## 2015-07-05 ENCOUNTER — Ambulatory Visit (INDEPENDENT_AMBULATORY_CARE_PROVIDER_SITE_OTHER): Payer: Medicaid Other

## 2015-07-05 ENCOUNTER — Ambulatory Visit (INDEPENDENT_AMBULATORY_CARE_PROVIDER_SITE_OTHER): Payer: Medicaid Other | Admitting: Obstetrics and Gynecology

## 2015-07-05 ENCOUNTER — Other Ambulatory Visit: Payer: Self-pay | Admitting: Obstetrics and Gynecology

## 2015-07-05 ENCOUNTER — Telehealth: Payer: Self-pay | Admitting: Obstetrics and Gynecology

## 2015-07-05 VITALS — BP 115/74 | HR 75 | Wt 276.9 lb

## 2015-07-05 DIAGNOSIS — Z23 Encounter for immunization: Secondary | ICD-10-CM | POA: Diagnosis not present

## 2015-07-05 DIAGNOSIS — O209 Hemorrhage in early pregnancy, unspecified: Secondary | ICD-10-CM

## 2015-07-05 DIAGNOSIS — Z3402 Encounter for supervision of normal first pregnancy, second trimester: Secondary | ICD-10-CM | POA: Diagnosis not present

## 2015-07-05 DIAGNOSIS — O219 Vomiting of pregnancy, unspecified: Secondary | ICD-10-CM

## 2015-07-05 DIAGNOSIS — O4691 Antepartum hemorrhage, unspecified, first trimester: Secondary | ICD-10-CM

## 2015-07-05 DIAGNOSIS — G43909 Migraine, unspecified, not intractable, without status migrainosus: Secondary | ICD-10-CM | POA: Insufficient documentation

## 2015-07-05 DIAGNOSIS — O09891 Supervision of other high risk pregnancies, first trimester: Secondary | ICD-10-CM

## 2015-07-05 DIAGNOSIS — O0992 Supervision of high risk pregnancy, unspecified, second trimester: Secondary | ICD-10-CM

## 2015-07-05 DIAGNOSIS — O34219 Maternal care for unspecified type scar from previous cesarean delivery: Secondary | ICD-10-CM

## 2015-07-05 DIAGNOSIS — O09211 Supervision of pregnancy with history of pre-term labor, first trimester: Secondary | ICD-10-CM

## 2015-07-05 LAB — POCT URINALYSIS DIPSTICK
Bilirubin, UA: NEGATIVE
GLUCOSE UA: NEGATIVE
Ketones, UA: NEGATIVE
Leukocytes, UA: NEGATIVE
Nitrite, UA: NEGATIVE
Protein, UA: NEGATIVE
SPEC GRAV UA: 1.01
UROBILINOGEN UA: 1
pH, UA: 6

## 2015-07-05 MED ORDER — PROMETHAZINE HCL 25 MG PO TABS: 25.0000 mg | ORAL_TABLET | Freq: Four times a day (QID) | ORAL | Status: DC | PRN

## 2015-07-05 NOTE — Progress Notes (Signed)
ROB: Patient notes having stomach virus with nausea/vomiting and diarrhea over the weekend.  Also notes that she still has "morning sickness" in addition to recent illness.  Notes "cramping/contraction-like pain" in abdomen at times.  Reports that Zofran and Unisom not helping with nausea/vomiting.  Will also prescribe Phenergan, advised on maintaining adequate hydration.  For serum AFP today.  Reviewed baseline PIH labs and early glucola (wnl). Will repeat glucola again at 26-28 weeks.  Given flu shot.  For 17-OHP for h/o preterm delivery x 2.  Advised to begin daily baby aspirin.  To have cervical length done this week, for anatomy scan in 3-4 weeks.  RTC in 4 weeks .

## 2015-07-05 NOTE — Telephone Encounter (Signed)
This pt was her this am and Dr. Valentino Saxon [facility] 2 RX to [facility], she called and they didn't have them and asked for them to be resent. Well she wants to change pharmacy all together. Please send the RX to [facility] in [place] and change the pharmacy in the computer from now on to [facility].

## 2015-07-05 NOTE — Telephone Encounter (Signed)
Resent pt's rx for phenergan to [facility] in [place].

## 2015-07-08 LAB — AFP, SERUM, OPEN SPINA BIFIDA
AFP MoM: 2.9
AFP Value: 93.1 ng/mL
Gest. Age on Collection Date: 18.6 weeks
MATERNAL AGE AT EDD: 29.4 a
OSBR Risk 1 IN: 139
PDF: 0
TEST RESULTS AFP: POSITIVE — AB
Weight: 276 [lb_av]

## 2015-07-13 ENCOUNTER — Telehealth: Payer: Self-pay

## 2015-07-13 DIAGNOSIS — O350XX1 Maternal care for (suspected) central nervous system malformation in fetus, fetus 1: Secondary | ICD-10-CM

## 2015-07-13 NOTE — Telephone Encounter (Signed)
-----   Message from Hildred Laser, MD sent at 07/12/2015  5:16 PM EST ----- Please inform patient that her screen was positive for spina bifida.  She will need a referral to [facility].

## 2015-07-13 NOTE — Telephone Encounter (Signed)
Called pt & LM for her to call back

## 2015-07-14 NOTE — Telephone Encounter (Signed)
Called pt informed her of + spina bifida screen. Advised pt of the need for referral to [facility]. Offered pt reassurance.

## 2015-07-15 ENCOUNTER — Ambulatory Visit: Payer: Medicaid Other

## 2015-07-19 ENCOUNTER — Ambulatory Visit (INDEPENDENT_AMBULATORY_CARE_PROVIDER_SITE_OTHER): Payer: Medicaid Other | Admitting: Obstetrics and Gynecology

## 2015-07-19 VITALS — BP 130/93 | HR 92 | Wt 275.0 lb

## 2015-07-19 DIAGNOSIS — Z8751 Personal history of pre-term labor: Secondary | ICD-10-CM | POA: Diagnosis not present

## 2015-07-19 MED ORDER — HYDROXYPROGESTERONE CAPROATE 250 MG/ML IM OIL
250.0000 mg | TOPICAL_OIL | Freq: Once | INTRAMUSCULAR | Status: AC
  Administered 2015-07-19: 250 mg via INTRAMUSCULAR

## 2015-07-19 NOTE — Progress Notes (Signed)
Patient ID: Barbara Hayes, female   DOB: Nov 18, 1985, 30 y.o.   MRN: [id number] Pt receiving first injection of hydroxyprogesterone for H/O pre-term delivery. Explained whey injection is needed and how often this is to be given. Information printed on AVS for this medication. Pt also inquired about [facility] referral for spina bifida. This will be sent today per referral coordinator.

## 2015-07-19 NOTE — Patient Instructions (Signed)
Hydroxyprogesterone solution for injection What is this medicine? HYDROXYPROGESTERONE (hye drox ee proe JES ter one) is a female hormone. This medicine is used in women who are pregnant and who have delivered a baby too early (preterm) in the past. It helps lower the risk of having a preterm baby again. This medicine may be used for other purposes; ask your health care provider or pharmacist if you have questions. What should I tell my health care provider before I take this medicine? They need to know if you have any of these conditions: -blood clotting disorders -breast, cervical, uterine, or vaginal cancer -depression -diabetes or prediabetes -heart disease -high blood pressure -kidney disease -liver disease -lung or breathing disease, like asthma -migraine headaches -seizures -vaginal bleeding -an unusual or allergic reaction to hydroxyprogesterone, other hormones, medicines, foods, dyes, castor oil, benzyl alcohol, or other preservatives -breast-feeding How should I use this medicine? This medicine is for injection into a muscle. It is given by a health care professional in a hospital or clinic setting. You are likely to get an injection once a week to prevent preterm delivery. Talk to your pediatrician regarding the use of this medicine in children. Special care may be needed. Overdosage: If you think you have taken too much of this medicine contact a poison control center or emergency room at once. NOTE: This medicine is only for you. Do not share this medicine with others. What if I miss a dose? It is important not to miss your dose. Call your doctor or health care professional if you are unable to keep an appointment. What may interact with this medicine? -acetaminophen -bupropion -clozapine -efavirenz -halothane -methadone -nicotine -theophylline, aminophylline -tizanidine This list may not describe all possible interactions. Give your health care provider a list of all  the medicines, herbs, non-prescription drugs, or dietary supplements you use. Also tell them if you smoke, drink alcohol, or use illegal drugs. Some items may interact with your medicine. What should I watch for while using this medicine? Your condition will be monitored carefully while you are receiving this medicine. What side effects may I notice from receiving this medicine? Side effects that you should report to your doctor or health care professional as soon as possible: -allergic reactions like skin rash, itching or hives, swelling of the face, lips, or tongue -breathing problems -breast tissue changes or discharge -changes in vision -confusion, trouble speaking or understanding -depressed mood -increased hunger or thirst -increased urination -pain, redness, or irritation at site where injected -pain, swelling, warmth in the leg -shortness of breath, chest pain, swelling in a leg -sudden numbness or weakness of the face, arm or leg -sudden severe headaches -trouble walking, dizziness, loss of balance or coordination -unusually weak or tired -vaginal bleeding -yellowing of the eyes or skin Side effects that usually do not require medical attention (report to your doctor or health care professional if they continue or are bothersome): -changes in emotions or moods -diarrhea -fluid retention and swelling -nausea This list may not describe all possible side effects. Call your doctor for medical advice about side effects. You may report side effects to FDA at 1-800-FDA-1088. Where should I keep my medicine? This drug is given in a hospital or clinic and will not be stored at home. NOTE: This sheet is a summary. It may not cover all possible information. If you have questions about this medicine, talk to your doctor, pharmacist, or health care provider.    2016, [employer]. (2009-07-26 11:17:12)

## 2015-07-21 ENCOUNTER — Telehealth: Payer: Self-pay | Admitting: Obstetrics and Gynecology

## 2015-07-21 ENCOUNTER — Ambulatory Visit (INDEPENDENT_AMBULATORY_CARE_PROVIDER_SITE_OTHER): Payer: Medicaid Other | Admitting: Obstetrics and Gynecology

## 2015-07-21 VITALS — BP 138/85 | HR 98 | Wt 275.8 lb

## 2015-07-21 DIAGNOSIS — O34219 Maternal care for unspecified type scar from previous cesarean delivery: Secondary | ICD-10-CM | POA: Diagnosis not present

## 2015-07-21 DIAGNOSIS — O09891 Supervision of other high risk pregnancies, first trimester: Secondary | ICD-10-CM

## 2015-07-21 DIAGNOSIS — O0992 Supervision of high risk pregnancy, unspecified, second trimester: Secondary | ICD-10-CM

## 2015-07-21 DIAGNOSIS — O09211 Supervision of pregnancy with history of pre-term labor, first trimester: Secondary | ICD-10-CM

## 2015-07-21 LAB — POCT URINALYSIS DIPSTICK
BILIRUBIN UA: NEGATIVE
GLUCOSE UA: NEGATIVE
KETONES UA: NEGATIVE
Leukocytes, UA: NEGATIVE
NITRITE UA: NEGATIVE
SPEC GRAV UA: 1.02
UROBILINOGEN UA: NEGATIVE
pH, UA: 6

## 2015-07-21 NOTE — Telephone Encounter (Signed)
Pt called and she is [redacted] weeks pregnant and she woke up this morning with a migrain and she is very shacky and feels that her heart is going to jump out of her chest and she just got her first 17p shot this week, she has not felt baby move all day.

## 2015-07-21 NOTE — Telephone Encounter (Signed)
Spoke with pt she states that she has a history of migraines and has had one all day with no relief with Tylenol. Pt states that she has been feeling "limp" all day. Pt notes that she has not felt baby move all day. Advised pt to come in immediately. Pt gave verbal understanding.

## 2015-07-21 NOTE — Progress Notes (Signed)
   OBSTETRIC PROBLEM VISIT CLINIC NOTE  Subjective:   Barbara Hayes is a 30 y.o. [phone] [redacted]w[redacted]d being seen today for her obstetrical visit.  Patient reports continued nausea, vomiting, general malaise for several days, and decreased fetal movement x 1 day.  Denies contractions, vaginal bleeding or leaking of fluid.   Also notes that she has not yet heard back for appointment for [facility] regarding positive MSAFP.  Patient notes recent sick contacts at home (several children with URI symptoms).   The following portions of the patient's history were reviewed and updated as appropriate: allergies, current medications, past family history, past medical history, past social history, past surgical history and problem list.   Objective:  BP 138/85 mmHg  Pulse 98  Wt 275 lb 12.8 oz (125.102 kg)  LMP 02/25/2015 (Approximate)  FHT: Fetal Heart Rate (bpm): 148  Uterine Size: Fundal Height: 22 cm  Fetal Movement: Movement: Present (decreased)   Abdomen:  soft, gravid, appropriate for gestational age,non-tender, morbidly obese  Vaginal:  deferred  Cervix: deferred    Assessment and Plan:   Pregnancy:  [id number] at [redacted]w[redacted]d  1. Supervision of high risk pregnancy in second trimester 2. H/o preterm delivery x 2, currently receiving weekly 17-OHP injections 3. Morbid obesity - borderline early glucola (136), will repeat at 28 weeks.  Has not begun aspirin as previously recommende due to nausea/vomiting.  4. Nausea/vomiting - taken Zofran with modest relief, prescribed Phenergan last visit which helps, however makes patient drowsy.  Informed patient to take Zofran scheduled, and intermittently take Phenergan.   5. Will f/u with [facility] appointment regarding + MSAFP.  6.  Decreased fetal movement - advised patient that movement may not always be appreciated at current gestational age regularly, in addition, has morbid obesity which can obscure detection of movement.  7.  F/u in 2 weeks for  regularly scheduled appointment.     Hildred Laser, MD [facility]

## 2015-07-26 ENCOUNTER — Ambulatory Visit (INDEPENDENT_AMBULATORY_CARE_PROVIDER_SITE_OTHER): Payer: Medicaid Other

## 2015-07-26 VITALS — BP 135/93 | HR 104 | Wt 280.2 lb

## 2015-07-26 DIAGNOSIS — O09211 Supervision of pregnancy with history of pre-term labor, first trimester: Secondary | ICD-10-CM | POA: Diagnosis not present

## 2015-07-26 DIAGNOSIS — O0992 Supervision of high risk pregnancy, unspecified, second trimester: Secondary | ICD-10-CM

## 2015-07-26 DIAGNOSIS — O09891 Supervision of other high risk pregnancies, first trimester: Secondary | ICD-10-CM

## 2015-07-26 MED ORDER — HYDROXYPROGESTERONE CAPROATE 250 MG/ML IM OIL
250.0000 mg | TOPICAL_OIL | Freq: Once | INTRAMUSCULAR | Status: AC
  Administered 2015-07-26: 250 mg via INTRAMUSCULAR

## 2015-07-26 NOTE — Progress Notes (Signed)
Patient ID: Barbara Hayes, female   DOB: [birth year], 30 y.o.   MRN: [id number] Pt presents for hydroxyprogesterone injection given for h/o pre-term labor. No contractions or vaginal bleeding.

## 2015-07-29 ENCOUNTER — Other Ambulatory Visit: Payer: Self-pay | Admitting: Obstetrics and Gynecology

## 2015-07-29 DIAGNOSIS — Z0489 Encounter for examination and observation for other specified reasons: Secondary | ICD-10-CM

## 2015-07-29 DIAGNOSIS — IMO0002 Reserved for concepts with insufficient information to code with codable children: Secondary | ICD-10-CM

## 2015-08-01 ENCOUNTER — Ambulatory Visit (HOSPITAL_BASED_OUTPATIENT_CLINIC_OR_DEPARTMENT_OTHER)
Admission: RE | Admit: 2015-08-01 | Discharge: 2015-08-01 | Disposition: A | Discharge status: Home / Self Care | Payer: Medicaid Other | Source: Ambulatory Visit

## 2015-08-01 ENCOUNTER — Ambulatory Visit
Admission: RE | Admit: 2015-08-01 | Discharge: 2015-08-01 | Disposition: A | Discharge status: Home / Self Care | Payer: Medicaid Other | Source: Ambulatory Visit | Attending: Obstetrics & Gynecology | Admitting: Obstetrics & Gynecology

## 2015-08-01 DIAGNOSIS — R772 Abnormality of alphafetoprotein: Secondary | ICD-10-CM | POA: Insufficient documentation

## 2015-08-01 DIAGNOSIS — O26892 Other specified pregnancy related conditions, second trimester: Secondary | ICD-10-CM | POA: Insufficient documentation

## 2015-08-01 DIAGNOSIS — Z3A22 22 weeks gestation of pregnancy: Secondary | ICD-10-CM | POA: Insufficient documentation

## 2015-08-01 NOTE — Progress Notes (Addendum)
Referring Provider: [facility] Length of consultation 40 minutes  Ms. Rossel was referred for genetic counseling to discuss the results of her Maternal Serum Screen and her prenatal testing options.  This note is a summary of our discussion.  To review, a Maternal Serum AFP is a maternal blood test that measures maternal serum AFP levels to determine if a pregnancy is at higher risk for certain birth defects or problems; however, it cannot diagnose or rule out these conditions.  It is important to remember that an abnormal maternal serum screen (MSS) test does not necessarily mean that the baby has a problem.  Maternal serum screening can identify approximately 80% of open neural tube defects (i.e., spina bifida).  The neural tube consists of the fetal head and spine, and if this structure fails to close during development, spina bifida (open spine) or anencephaly (open skull) could result.   The MSS result revealed an increased level of AFP.  The value was 2.9 MOM (multiples of the median) which is above the cutoff of 2.5 MOM.  Increased levels of AFP occur for many reasons including: inaccurate pregnancy dating, the presence of twins, pregnancy complications, neural tube defects, abdominal wall defects or a normal pregnancy.  Before MSS screening, her risk to have a child with a neural tube defect was 1 or 2 per 1000. Given the elevated AFP value, the risk is now 1 in 139.  This means that the chance that this baby does not have a neural tube defect is >99%..  We discussed the following prenatal testing options for this pregnancy:   Ultrasound was recommended to evaluate the fetal anatomy, particularly the neural tube and to confirm the gestational age.  An ultrasound performed today confirmed that the patient was 22 weeks and 3 days pregnant.  There was no evidence of a neural tube defect on this ultrasound, though the distal spine and cord insertion were suboptimally visualized.   Overall images of the fetal anatomy were limited by maternal acoustics.  The remainder of the fetal anatomy appeared normal, although this cannot rule out all abnormalities.  Redraw AFP is offered when the level of AFP MoM does not exceed a particular laboratory cutoff.  Redrawing maternal blood for a repeat can provide patients with a better assessment of their risk to have a child with a neural tube defect.  Amniocentesis was offered because the AFP MoM exceeded a particular laboratory cutoff.  Amniocentesis can detect greater than 98% of neural tube defects.  It involves the removal of a small amount of amniotic fluid from the sac surrounding the fetus with the use of a thin needle through the maternal abdomen and uterus.  Fetal cells are directly evaluated and approximately 99% of chromosome conditions may also be detected.  Ultrasound guidance is used throughout the procedure. The main risks to this procedure include complications leading to miscarriage in less than 1 in 200 cases (0.5%).    It is important to remember that each pregnancy in the general population has a 2-3% chance for a birth defect that might not be detected prenatally.  We recommend 0.4mg  of folic acid, a B-complex vitamin, for all women of childbearing age.  Folic acid may help to prevent neural tube defects, and should be taken prior to and during pregnancy.  We obtained a detailed family history . There are no other family members with known genetic conditions, mental retardation or birth defects.  We also obtained a detailed pregnancy history. Ms. Riden  reported no complications thus far and deny the use of prescription medications, recreational drugs, tobacco or alcohol.  She did have some vaginal bleeding around the time of the AFP testing, which could also contribute to the elevated results.  After careful consideration, Ms. Nancarrow elected to have an ultrasound only and to decline a repeat MSAFP test, CF testing or  amniocentesis.  Unexplained elevations of maternal serum AFP have been associated with adverse pregnancy outcomes such as low birth weight, preterm labor, pre-eclampsia or fetal demise.  For this reason, another ultrasound should be considered at 26 to [redacted] weeks gestation to reassess fetal growth.  If further questions or concerns arise, please do not hesitate to call us at [phone].  Cherly Anderson, MS, CGC  Note:  The patient had Pan[facility] testing at [facility] performed.  The first result was inconclusive due to low fetal fraction.  The repeat was normal per the patient, but we have not received a copy of this result and it is not in EPIC at this time.  If abnormal, we are happy to speak with the patient further about the results.  I was present for this appt. Jakyren Fluegge, [place] A, MD

## 2015-08-02 ENCOUNTER — Ambulatory Visit (INDEPENDENT_AMBULATORY_CARE_PROVIDER_SITE_OTHER): Payer: Medicaid Other

## 2015-08-02 ENCOUNTER — Ambulatory Visit (INDEPENDENT_AMBULATORY_CARE_PROVIDER_SITE_OTHER): Payer: Medicaid Other | Admitting: Obstetrics and Gynecology

## 2015-08-02 ENCOUNTER — Encounter: Payer: Self-pay | Admitting: Obstetrics and Gynecology

## 2015-08-02 VITALS — BP 141/82 | HR 93 | Wt 279.4 lb

## 2015-08-02 DIAGNOSIS — O09212 Supervision of pregnancy with history of pre-term labor, second trimester: Secondary | ICD-10-CM

## 2015-08-02 DIAGNOSIS — IMO0002 Reserved for concepts with insufficient information to code with codable children: Secondary | ICD-10-CM

## 2015-08-02 DIAGNOSIS — O0992 Supervision of high risk pregnancy, unspecified, second trimester: Secondary | ICD-10-CM | POA: Diagnosis not present

## 2015-08-02 DIAGNOSIS — E669 Obesity, unspecified: Secondary | ICD-10-CM

## 2015-08-02 DIAGNOSIS — Z36 Encounter for antenatal screening of mother: Secondary | ICD-10-CM

## 2015-08-02 DIAGNOSIS — Z131 Encounter for screening for diabetes mellitus: Secondary | ICD-10-CM

## 2015-08-02 DIAGNOSIS — O09892 Supervision of other high risk pregnancies, second trimester: Secondary | ICD-10-CM

## 2015-08-02 DIAGNOSIS — O99212 Obesity complicating pregnancy, second trimester: Secondary | ICD-10-CM

## 2015-08-02 DIAGNOSIS — Z0489 Encounter for examination and observation for other specified reasons: Secondary | ICD-10-CM

## 2015-08-02 LAB — POCT URINALYSIS DIPSTICK
BILIRUBIN UA: NEGATIVE
GLUCOSE UA: NEGATIVE
KETONES UA: NEGATIVE
Leukocytes, UA: NEGATIVE
Nitrite, UA: NEGATIVE
SPEC GRAV UA: 1.02
UROBILINOGEN UA: NEGATIVE
pH, UA: 6.5

## 2015-08-02 MED ORDER — HYDROXYPROGESTERONE CAPROATE 250 MG/ML IM OIL
250.0000 mg | TOPICAL_OIL | Freq: Once | INTRAMUSCULAR | Status: AC
  Administered 2015-08-02: 250 mg via INTRAMUSCULAR

## 2015-08-02 NOTE — Progress Notes (Signed)
ROB: Patient feeling better, notes nausea/vomiting has finally resolved.  Seen by [facility] yesterday regarding elevated AFP.  Patient had detailed sono, but unable to visualize spine adequately, for repeat in 4 weeks with [facility].  For repeat anatomy scan today for remainder of anatomy not visualized on initial scan.  Continue 17-OHP weekly.  RTC in 4 weeks.  Will need 28 week labs and 1 hr glucola at that time.

## 2015-08-08 ENCOUNTER — Ambulatory Visit: Payer: Medicaid Other

## 2015-08-09 ENCOUNTER — Ambulatory Visit (INDEPENDENT_AMBULATORY_CARE_PROVIDER_SITE_OTHER): Payer: Medicaid Other | Admitting: Obstetrics and Gynecology

## 2015-08-09 VITALS — BP 127/92

## 2015-08-09 DIAGNOSIS — O09212 Supervision of pregnancy with history of pre-term labor, second trimester: Secondary | ICD-10-CM

## 2015-08-09 DIAGNOSIS — O09892 Supervision of other high risk pregnancies, second trimester: Secondary | ICD-10-CM

## 2015-08-09 MED ORDER — HYDROXYPROGESTERONE CAPROATE 250 MG/ML IM OIL
250.0000 mg | TOPICAL_OIL | Freq: Once | INTRAMUSCULAR | Status: AC
  Administered 2015-08-09: 250 mg via INTRAMUSCULAR

## 2015-08-09 NOTE — Progress Notes (Signed)
Patient ID: Barbara Hayes, female   DOB: 1986-04-01, 30 y.o.   MRN: [id number] Pt presents for hydroxyprogesterone 17p injection. Pt denies any contractions or vaginal bleeding. B/P elevated 127/92. Pt's father has been in ER during the night with possible heart attack.

## 2015-08-16 ENCOUNTER — Ambulatory Visit (INDEPENDENT_AMBULATORY_CARE_PROVIDER_SITE_OTHER): Payer: Medicaid Other | Admitting: Obstetrics and Gynecology

## 2015-08-16 VITALS — BP 125/89 | HR 94 | Wt 282.0 lb

## 2015-08-16 DIAGNOSIS — O09212 Supervision of pregnancy with history of pre-term labor, second trimester: Secondary | ICD-10-CM | POA: Diagnosis not present

## 2015-08-16 DIAGNOSIS — O09892 Supervision of other high risk pregnancies, second trimester: Secondary | ICD-10-CM

## 2015-08-16 MED ORDER — RANITIDINE HCL 150 MG PO TABS: 150.0000 mg | ORAL_TABLET | Freq: Two times a day (BID) | ORAL | Status: AC

## 2015-08-16 MED ORDER — HYDROXYPROGESTERONE CAPROATE 250 MG/ML IM OIL
250.0000 mg | TOPICAL_OIL | Freq: Once | INTRAMUSCULAR | Status: AC
  Administered 2015-08-16: 250 mg via INTRAMUSCULAR

## 2015-08-16 NOTE — Progress Notes (Signed)
Patient ID: Barbara Hayes, female   DOB: [birth year], 30 y.o.   MRN: [id number] Pt presents for hydroxyprogesterone injection for h/o PTL.  No contractions or vaginal bleeding. Pt c/o severe heart burn, watching what she is eating and otc medications not working. Zantac . Bid sent to pharmacy.

## 2015-08-23 ENCOUNTER — Ambulatory Visit: Payer: Medicaid Other

## 2015-08-23 ENCOUNTER — Ambulatory Visit (INDEPENDENT_AMBULATORY_CARE_PROVIDER_SITE_OTHER): Payer: Medicaid Other | Admitting: Obstetrics and Gynecology

## 2015-08-23 VITALS — BP 172/90 | HR 91 | Wt 279.8 lb

## 2015-08-23 DIAGNOSIS — Z8751 Personal history of pre-term labor: Secondary | ICD-10-CM | POA: Diagnosis not present

## 2015-08-23 MED ORDER — HYDROXYPROGESTERONE CAPROATE 250 MG/ML IM OIL
250.0000 mg | TOPICAL_OIL | Freq: Once | INTRAMUSCULAR | Status: AC
  Administered 2015-08-23: 250 mg via INTRAMUSCULAR

## 2015-08-23 NOTE — Progress Notes (Signed)
Pt is here for her hydroxyprogesterone injection Denies any s/e, states she is doing well

## 2015-08-25 ENCOUNTER — Telehealth: Payer: Self-pay | Admitting: Obstetrics and Gynecology

## 2015-08-25 ENCOUNTER — Ambulatory Visit (INDEPENDENT_AMBULATORY_CARE_PROVIDER_SITE_OTHER): Payer: Medicaid Other | Admitting: Obstetrics and Gynecology

## 2015-08-25 ENCOUNTER — Encounter: Payer: Self-pay | Admitting: Obstetrics and Gynecology

## 2015-08-25 VITALS — BP 148/83 | HR 96 | Wt 280.8 lb

## 2015-08-25 DIAGNOSIS — L02229 Furuncle of trunk, unspecified: Secondary | ICD-10-CM

## 2015-08-25 DIAGNOSIS — O132 Gestational [pregnancy-induced] hypertension without significant proteinuria, second trimester: Secondary | ICD-10-CM

## 2015-08-25 DIAGNOSIS — O34219 Maternal care for unspecified type scar from previous cesarean delivery: Secondary | ICD-10-CM

## 2015-08-25 DIAGNOSIS — O0992 Supervision of high risk pregnancy, unspecified, second trimester: Secondary | ICD-10-CM

## 2015-08-25 DIAGNOSIS — O09212 Supervision of pregnancy with history of pre-term labor, second trimester: Secondary | ICD-10-CM

## 2015-08-25 DIAGNOSIS — R399 Unspecified symptoms and signs involving the genitourinary system: Secondary | ICD-10-CM

## 2015-08-25 DIAGNOSIS — O09892 Supervision of other high risk pregnancies, second trimester: Secondary | ICD-10-CM

## 2015-08-25 LAB — POCT URINALYSIS DIPSTICK
BILIRUBIN UA: NEGATIVE
GLUCOSE UA: NEGATIVE
KETONES UA: NEGATIVE
Leukocytes, UA: NEGATIVE
Nitrite, UA: NEGATIVE
Protein, UA: NEGATIVE
SPEC GRAV UA: 1.01
UROBILINOGEN UA: NEGATIVE
pH, UA: 6.5

## 2015-08-25 MED ORDER — CLINDAMYCIN HCL 150 MG PO CAPS: 150.0000 mg | ORAL_CAPSULE | Freq: Four times a day (QID) | ORAL | Status: DC

## 2015-08-25 NOTE — Telephone Encounter (Signed)
Called pt she states that she has a history of MRSA and believes that she may be infected again as she has an area on her stomach that is black and very sore. Advised pt that she needed to be seen asap. Please add pt to today's schedule at 10:30am. Aware that this is double booked.

## 2015-08-25 NOTE — Telephone Encounter (Signed)
Patient called stating she has a spot on her stomach that looks like mrsa. She is 3674w6d pregnant.Please Advise

## 2015-08-25 NOTE — Progress Notes (Signed)
Problem OB: Patient reports boil on left lower abdomen.  States that it has been increasing in size over the past few days, slightly tender.  Concerned for possible MRSA infection as she notes having one in the past. Will treat with Clindamycin. Sees [facility] Monday for f/u scan.  Continue 17-OHP weekly. Continue to monitor BPs.  Likely with GHTN (also had in prior pregnancies).  Elevate in past 2 visits.  [facility]. RTC in 2 weeks. Will need 28 week labs and 1 hr glucola at that time.

## 2015-08-26 ENCOUNTER — Other Ambulatory Visit: Payer: Self-pay

## 2015-08-26 DIAGNOSIS — O280 Abnormal hematological finding on antenatal screening of mother: Secondary | ICD-10-CM

## 2015-08-26 LAB — URINE CULTURE: Organism ID, Bacteria: NO GROWTH

## 2015-08-26 LAB — COMPREHENSIVE METABOLIC PANEL
ALBUMIN: 3.5 g/dL (ref 3.5–5.5)
ALK PHOS: 143 IU/L — AB (ref 39–117)
ALT: 9 IU/L (ref 0–32)
AST: 11 IU/L (ref 0–40)
Albumin/Globulin Ratio: 1.2 (ref 1.1–2.5)
BUN / CREAT RATIO: 11 (ref 8–20)
BUN: 5 mg/dL — AB (ref 6–20)
Bilirubin Total: 0.2 mg/dL (ref 0.0–1.2)
CALCIUM: 9.1 mg/dL (ref 8.7–10.2)
CO2: 18 mmol/L (ref 18–29)
CREATININE: 0.47 mg/dL — AB (ref 0.57–1.00)
Chloride: 100 mmol/L (ref 96–106)
GFR calc Af Amer: 154 mL/min/{1.73_m2} (ref >59)
GFR calc non Af Amer: 134 mL/min/{1.73_m2} (ref >59)
GLUCOSE: 77 mg/dL (ref 65–99)
Globulin, Total: 2.9 g/dL (ref 1.5–4.5)
Potassium: 3.9 mmol/L (ref 3.5–5.2)
Sodium: 138 mmol/L (ref 134–144)
Total Protein: 6.4 g/dL (ref 6.0–8.5)

## 2015-08-26 LAB — PROTEIN / CREATININE RATIO, URINE
Creatinine, Urine: 73.2 mg/dL
PROTEIN/CREAT RATIO: 253 mg/g{creat} — AB (ref 0–200)
Protein, Ur: 18.5 mg/dL

## 2015-08-28 DIAGNOSIS — O139 Gestational [pregnancy-induced] hypertension without significant proteinuria, unspecified trimester: Secondary | ICD-10-CM | POA: Insufficient documentation

## 2015-08-28 DIAGNOSIS — Z8679 Personal history of other diseases of the circulatory system: Secondary | ICD-10-CM | POA: Insufficient documentation

## 2015-08-29 ENCOUNTER — Ambulatory Visit
Admission: RE | Admit: 2015-08-29 | Discharge: 2015-08-29 | Disposition: A | Discharge status: Home / Self Care | Payer: Medicaid Other | Source: Ambulatory Visit | Attending: Maternal and Fetal Medicine | Admitting: Maternal and Fetal Medicine

## 2015-08-29 VITALS — BP 129/88 | HR 92 | Wt 277.0 lb

## 2015-08-29 DIAGNOSIS — O358XX1 Maternal care for other (suspected) fetal abnormality and damage, fetus 1: Secondary | ICD-10-CM | POA: Diagnosis not present

## 2015-08-29 DIAGNOSIS — O132 Gestational [pregnancy-induced] hypertension without significant proteinuria, second trimester: Secondary | ICD-10-CM

## 2015-08-29 DIAGNOSIS — Z3A26 26 weeks gestation of pregnancy: Secondary | ICD-10-CM | POA: Diagnosis not present

## 2015-08-29 DIAGNOSIS — R772 Abnormality of alphafetoprotein: Secondary | ICD-10-CM | POA: Diagnosis present

## 2015-08-29 DIAGNOSIS — O280 Abnormal hematological finding on antenatal screening of mother: Secondary | ICD-10-CM

## 2015-08-30 ENCOUNTER — Ambulatory Visit (INDEPENDENT_AMBULATORY_CARE_PROVIDER_SITE_OTHER): Payer: Medicaid Other | Admitting: Obstetrics and Gynecology

## 2015-08-30 ENCOUNTER — Encounter: Payer: Self-pay | Admitting: Obstetrics and Gynecology

## 2015-08-30 ENCOUNTER — Other Ambulatory Visit: Payer: Self-pay

## 2015-08-30 VITALS — BP 133/82 | HR 94 | Wt 278.8 lb

## 2015-08-30 DIAGNOSIS — O0992 Supervision of high risk pregnancy, unspecified, second trimester: Secondary | ICD-10-CM

## 2015-08-30 DIAGNOSIS — Z131 Encounter for screening for diabetes mellitus: Secondary | ICD-10-CM

## 2015-08-30 DIAGNOSIS — O09892 Supervision of other high risk pregnancies, second trimester: Secondary | ICD-10-CM

## 2015-08-30 DIAGNOSIS — O09212 Supervision of pregnancy with history of pre-term labor, second trimester: Secondary | ICD-10-CM

## 2015-08-30 DIAGNOSIS — O132 Gestational [pregnancy-induced] hypertension without significant proteinuria, second trimester: Secondary | ICD-10-CM

## 2015-08-30 DIAGNOSIS — O34219 Maternal care for unspecified type scar from previous cesarean delivery: Secondary | ICD-10-CM

## 2015-08-30 DIAGNOSIS — O99212 Obesity complicating pregnancy, second trimester: Secondary | ICD-10-CM

## 2015-08-30 LAB — POCT URINALYSIS DIPSTICK
BILIRUBIN UA: NEGATIVE
Glucose, UA: NEGATIVE
KETONES UA: NEGATIVE
Leukocytes, UA: NEGATIVE
Nitrite, UA: NEGATIVE
PROTEIN UA: NEGATIVE
SPEC GRAV UA: 1.01
Urobilinogen, UA: NEGATIVE
pH, UA: 6.5

## 2015-08-30 MED ORDER — HYDROXYPROGESTERONE CAPROATE 250 MG/ML IM OIL
250.0000 mg | TOPICAL_OIL | Freq: Once | INTRAMUSCULAR | Status: AC
  Administered 2015-08-30: 250 mg via INTRAMUSCULAR

## 2015-08-30 NOTE — Addendum Note (Signed)
Addended by: Frankey ShownGLANTON, Deverick Pruss C on: 08/30/2015 02:07 PM   Modules accepted: Orders

## 2015-08-30 NOTE — Progress Notes (Signed)
ROB:  Patient denies complaints.  Notes boil on left abdomen has healed with antibiotic treatment.  For 17-OHP.today.  Completing glucola today.  For Tdap and blood consent next visit.  Discussed contraception, considering Mirena IUD.  Desires to breastfeed.  Seen by [facility] yesterday, notes normal scan.  RTC in 2 weeks.

## 2015-08-31 LAB — HEMOGLOBIN AND HEMATOCRIT, BLOOD
HEMATOCRIT: 36.8 % (ref 34.0–46.6)
Hemoglobin: 12.6 g/dL (ref 11.1–15.9)

## 2015-08-31 LAB — GLUCOSE, 1 HOUR GESTATIONAL: GESTATIONAL DIABETES SCREEN: 130 mg/dL (ref 65–139)

## 2015-09-06 ENCOUNTER — Ambulatory Visit: Payer: Medicaid Other

## 2015-09-20 ENCOUNTER — Encounter: Payer: Medicaid Other | Admitting: Obstetrics and Gynecology

## 2015-10-10 ENCOUNTER — Inpatient Hospital Stay: Admission: RE | Admit: 2015-10-10 | Payer: Medicaid Other | Source: Ambulatory Visit

## 2016-02-15 ENCOUNTER — Encounter (HOSPITAL_COMMUNITY): Payer: Self-pay

## 2016-09-14 IMAGING — MR MRI HEAD WITHOUT AND WITH CONTRAST
12 series · 48 of 48 positions shown · IV contrast (20ml Multihance)
Comparison: CT head 08/05/2013

CLINICAL DATA: Headache and loss of vision.  Papilledema.

EXAM:
MRI HEAD WITHOUT AND WITH CONTRAST
TECHNIQUE: Multiplanar, multiecho pulse sequences of the brain and surrounding
structures were obtained without and with intravenous contrast.
CONTRAST:  20 mL MultiHance IV

[Series 2: T1 · sagittal · 5.0mm · 0.45mm/px · 4 of 27 slices shown (1 of 2)]
[im 1/27]
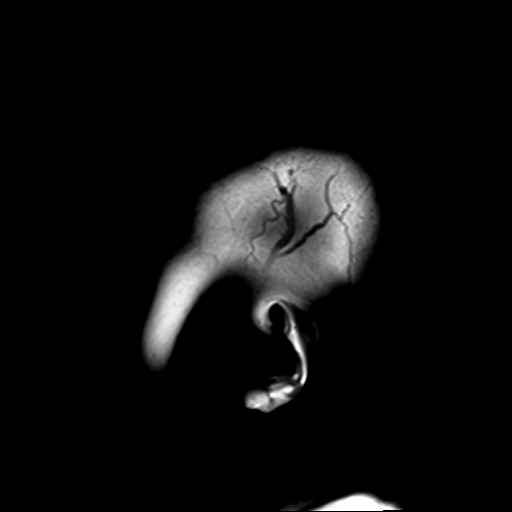
[im 9/27]
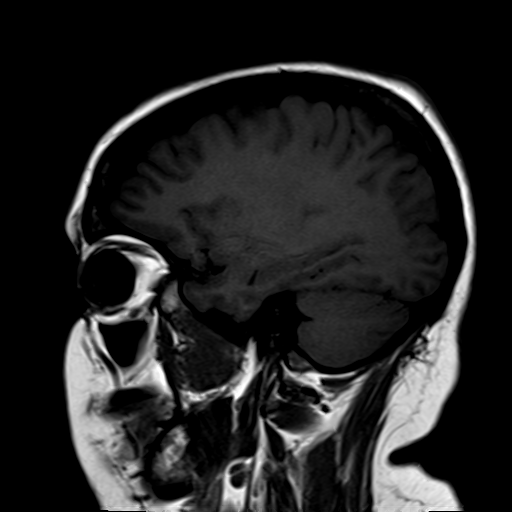
[im 18/27]
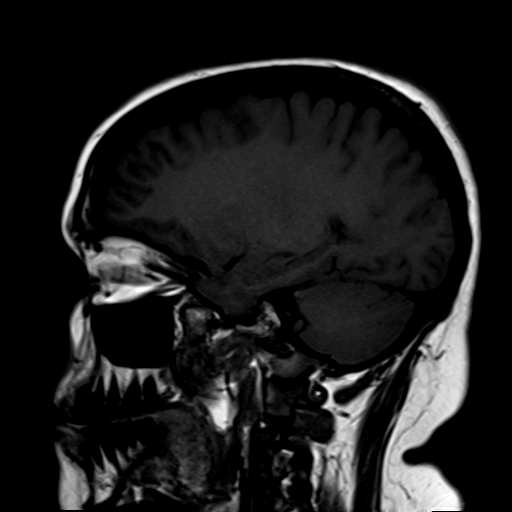
[im 27/27]
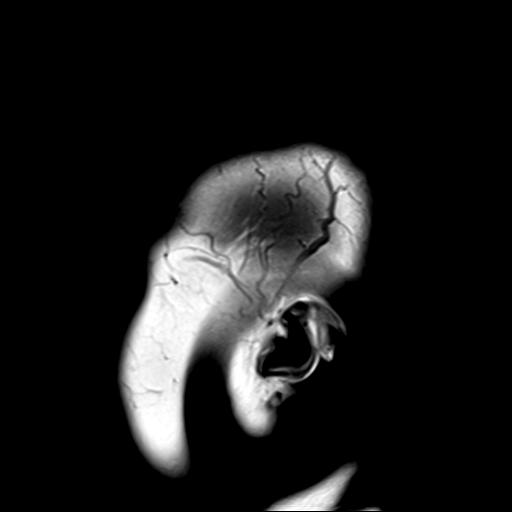

[Series 4: DWI · axial · 3.0mm · 1.80mm/px · z∈[-84,+77]mm · 5 of 55 slices shown (1 of 4)]
[im 1/55]
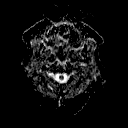
[im 14/55]
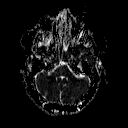
[im 28/55]
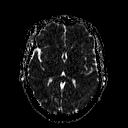
[im 41/55]
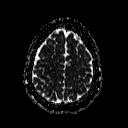
[im 55/55]
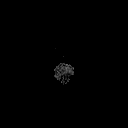

[Series 6: DWI · coronal · 3.0mm · 1.80mm/px · 5 of 47 slices shown (2 of 4)]
[im 1/47]
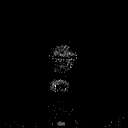
[im 12/47]
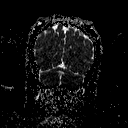
[im 24/47]
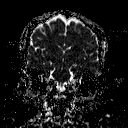
[im 35/47]
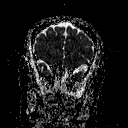
[im 47/47]
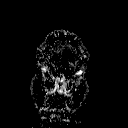

[Series 7: T2 · axial · 5.0mm · 0.60mm/px · z∈[-79,+77]mm · 2 of 25 slices shown (1 of 2)]
[im 1/25]
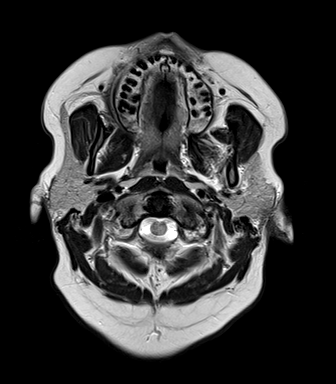
[im 25/25]
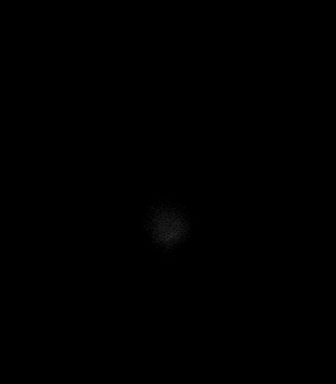

[Series 8: FLAIR · axial · 5.0mm · 0.45mm/px · z∈[-78,+77]mm · 2 of 25 slices shown]
[im 1/25]
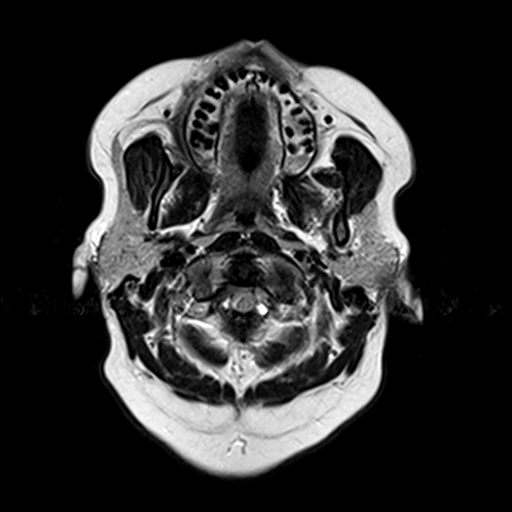
[im 25/25]
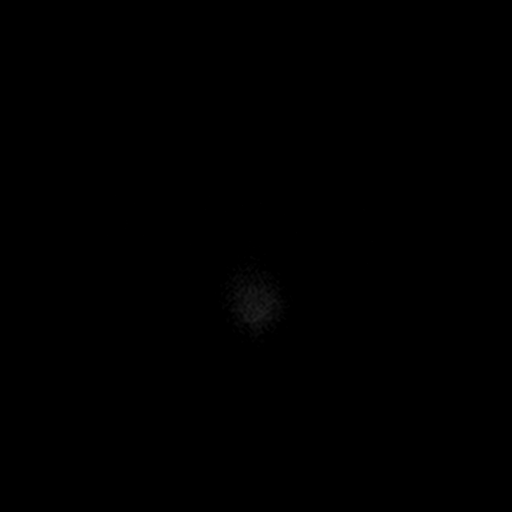

[Series 9: T2 · axial · 5.0mm · 0.45mm/px · z∈[-79,+77]mm · 2 of 25 slices shown (2 of 2)]
[im 1/25]
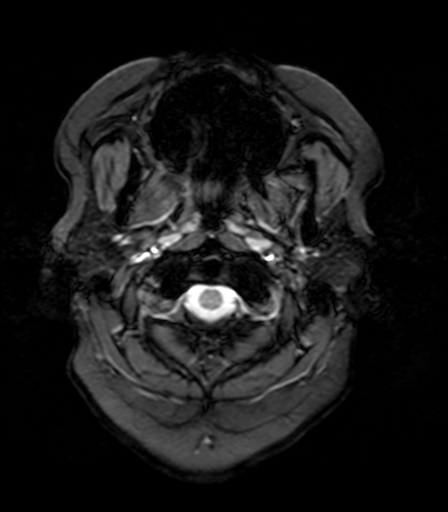
[im 25/25]
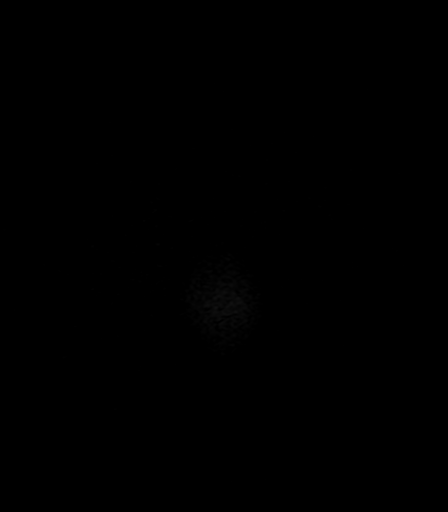

[Series 10: T1 · axial · 3.0mm · 1.00mm/px · z∈[-88,+88]mm · 6 of 60 slices shown (2 of 2)]
[im 1/60]
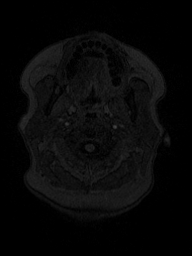
[im 12/60]
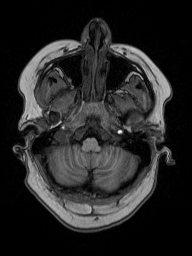
[im 24/60]
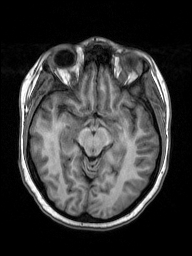
[im 36/60]
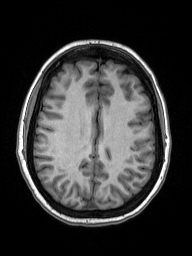
[im 48/60]
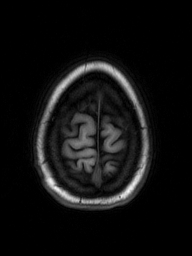
[im 60/60]
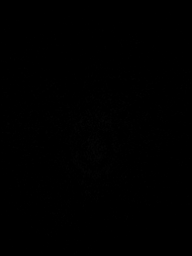

[Series 11: T2 post-contrast · coronal · 5.0mm · 0.49mm/px · 3 of 29 slices shown]
[im 1/29]
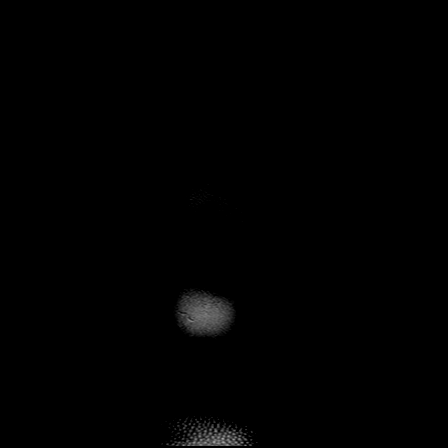
[im 15/29]
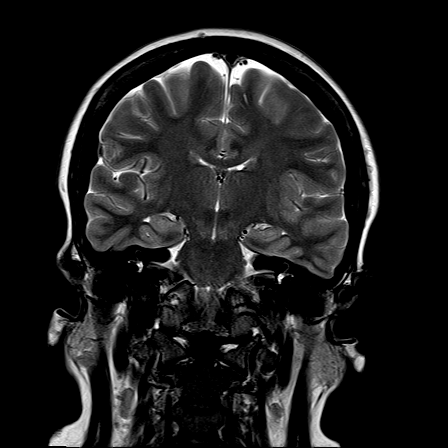
[im 29/29]
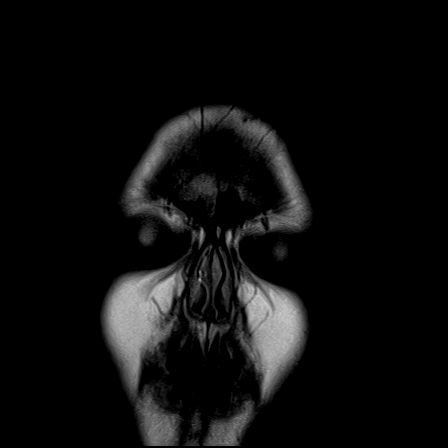

[Series 12: T1 post-contrast · axial · 3.0mm · 1.00mm/px · z∈[-88,+88]mm · 6 of 60 slices shown (1 of 2)]
[im 1/60]
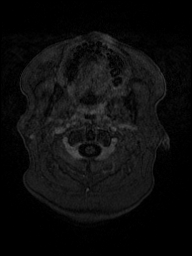
[im 12/60]
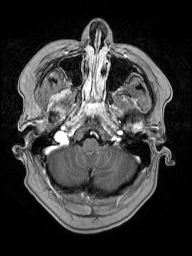
[im 24/60]
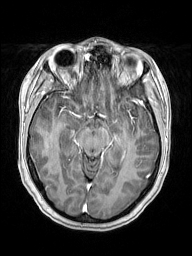
[im 36/60]
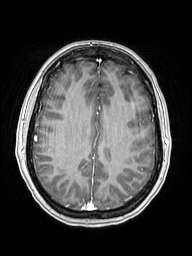
[im 48/60]
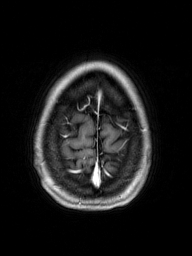
[im 60/60]
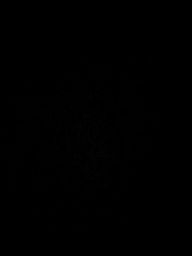

[Series 13: T1 post-contrast · coronal · 5.0mm · 0.43mm/px · 3 of 29 slices shown (2 of 2)]
[im 1/29]
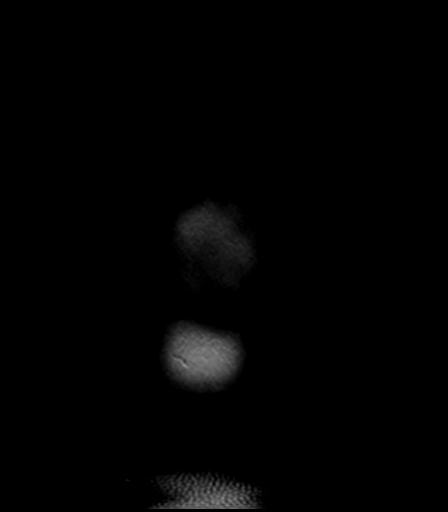
[im 15/29]
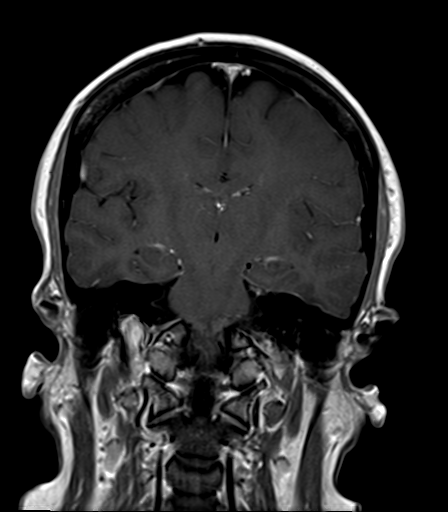
[im 29/29]
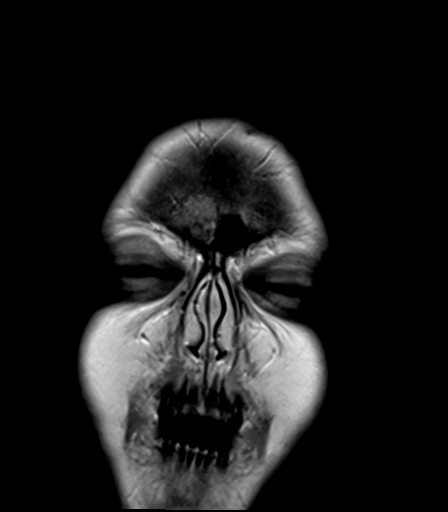

[Series 100: DWI · axial · 3.0mm · 1.80mm/px · z∈[-84,+77]mm · 5 of 55 slices shown (3 of 4)]
[im 1/55]
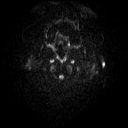
[im 14/55]
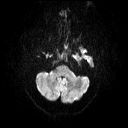
[im 28/55]
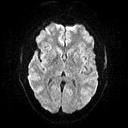
[im 41/55]
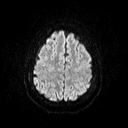
[im 55/55]
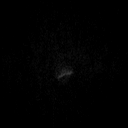

[Series 101: DWI · coronal · 3.0mm · 1.80mm/px · 5 of 47 slices shown (4 of 4)]
[im 1/47]
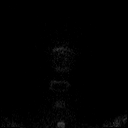
[im 12/47]
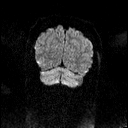
[im 24/47]
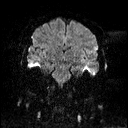
[im 35/47]
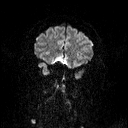
[im 47/47]
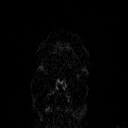

[48 of 48 positions shown; findings below may reference images not displayed]

FINDINGS: Ventricle size is normal. Cerebral volume is normal. Negative for
Chiari malformation. Pituitary normal in size.

Negative for acute or chronic infarction

Negative for demyelinating disease. Cerebral white matter is normal.
Brainstem and cerebellum are normal

Negative for hemorrhage or fluid collection

Negative for mass or edema.

Normal enhancement following contrast infusion.

Mucosal edema throughout the paranasal sinuses without air-fluid
level.
IMPRESSION: Normal MRI of the brain with contrast

Chronic sinusitis.

## 2017-06-30 ENCOUNTER — Encounter: Payer: Self-pay | Admitting: Emergency Medicine

## 2017-06-30 ENCOUNTER — Emergency Department
Admission: EM | Admit: 2017-06-30 | Discharge: 2017-06-30 | Disposition: A | Discharge status: Home / Self Care | Payer: Medicaid Other | Attending: Emergency Medicine | Admitting: Emergency Medicine

## 2017-06-30 ENCOUNTER — Other Ambulatory Visit: Payer: Self-pay

## 2017-06-30 DIAGNOSIS — R591 Generalized enlarged lymph nodes: Secondary | ICD-10-CM

## 2017-06-30 DIAGNOSIS — I10 Essential (primary) hypertension: Secondary | ICD-10-CM | POA: Diagnosis not present

## 2017-06-30 DIAGNOSIS — F419 Anxiety disorder, unspecified: Secondary | ICD-10-CM | POA: Insufficient documentation

## 2017-06-30 DIAGNOSIS — R599 Enlarged lymph nodes, unspecified: Secondary | ICD-10-CM | POA: Diagnosis not present

## 2017-06-30 DIAGNOSIS — H9201 Otalgia, right ear: Secondary | ICD-10-CM

## 2017-06-30 DIAGNOSIS — Z79899 Other long term (current) drug therapy: Secondary | ICD-10-CM | POA: Diagnosis not present

## 2017-06-30 DIAGNOSIS — F329 Major depressive disorder, single episode, unspecified: Secondary | ICD-10-CM | POA: Diagnosis not present

## 2017-06-30 MED ORDER — PREDNISONE 10 MG PO TABS: ORAL_TABLET | ORAL | 0 refills | Status: DC

## 2017-06-30 MED ORDER — CIPROFLOXACIN-DEXAMETHASONE 0.3-0.1 % OT SUSP: 4.0000 [drp] | Freq: Two times a day (BID) | OTIC | 0 refills | Status: DC

## 2017-06-30 MED ORDER — AZITHROMYCIN 250 MG PO TABS: ORAL_TABLET | ORAL | 0 refills | Status: DC

## 2017-06-30 MED ORDER — CLINDAMYCIN HCL 300 MG PO CAPS: 300.0000 mg | ORAL_CAPSULE | Freq: Three times a day (TID) | ORAL | 0 refills | Status: AC

## 2017-06-30 MED ORDER — NEOMYCIN-POLYMYXIN-HC 3.5-10000-1 OT SOLN: 4.0000 [drp] | Freq: Three times a day (TID) | OTIC | 0 refills | Status: AC

## 2017-06-30 MED ORDER — DEXAMETHASONE SODIUM PHOSPHATE 10 MG/ML IJ SOLN
10.0000 mg | Freq: Once | INTRAMUSCULAR | Status: AC
  Administered 2017-06-30: 10 mg via INTRAMUSCULAR
  Filled 2017-06-30: qty 1

## 2017-06-30 NOTE — ED Triage Notes (Signed)
Pt ambulatory into ED c/o Right ear pain and drainage. Pt also states that she feels like the right side of her face is swollen. Pt in NAD at this time. Respirations are equal and unlabored, color WNL.

## 2017-06-30 NOTE — ED Provider Notes (Signed)
[facility] Emergency Department Provider Note  ____________________________________________  Time seen: Approximately 8:11 AM  I have reviewed the triage vital signs and the nursing notes.   HISTORY  Chief Complaint Otalgia    HPI Barbara Hayes is a 32 y.o. female that presents to the emergency department for evaluation of right ear pain and drainage out of ear for 1 day.  Drainage out of the ear was clear this morning.  Pain is primarily below her ear on her  neck.  Patient has a tragus piercing that is about 251 month old and this does not hurt.  She has been congested for about a week but congestion improves throughout the day.  No dental pain.  She had an ear infection several years ago.  No headache, fever, sore throat, SOB, CP.    Past Medical History:  Diagnosis Date  . Anxiety and depression   . Hypertension   . IIH (idiopathic intracranial hypertension) 2015   Has had 2 spinal taps to relieve pressure  . Migraines     Patient Active Problem List   Diagnosis Date Noted  . Gestational hypertension 08/28/2015  . H/O: hypertension 08/28/2015  . Elevated AFP 08/01/2015  . Migraines 07/05/2015  . Supervision of high risk pregnancy in second trimester 07/05/2015  . H/O preterm delivery, currently pregnant 06/07/2015  . Anxiety and depression 05/24/2015  . H/O abnormal cervical Papanicolaou smear 05/24/2015  . H/O renal calculi 05/24/2015  . Morbid obesity (HCC) 05/24/2015  . Subchorionic hematoma in first trimester, antepartum 05/24/2015  . Nausea and vomiting during pregnancy 05/24/2015  . H/O cesarean section complicating pregnancy 04/12/2015  . History of twin pregnancy in prior pregnancy 04/12/2015    Past Surgical History:  Procedure Laterality Date  . CESAREAN SECTION      Prior to Admission medications   Medication Sig Start Date End Date Taking? Authorizing Provider  azithromycin (ZITHROMAX Z-PAK) 250 MG tablet Take 2 tablets  (500 mg) on  Day 1,  followed by 1 tablet (250 mg) once daily on Days 2 through 5. 06/30/17   Enid DerryWagner, Khush Pasion, PA-C  clindamycin (CLEOCIN) 300 MG capsule Take 1 capsule (300 mg total) by mouth 3 (three) times daily for 10 days. 06/30/17 07/10/17  Enid DerryWagner, Ricky Gallery, PA-C  MAKENA 250 MG/ML OIL injection INJECT 1ML INTRAMUSCULARLY ONCE A WEEK 08/10/15   [provider]  neomycin-polymyxin-hydrocortisone (CORTISPORIN) OTIC solution Place 4 drops into the left ear 3 (three) times daily for 10 days. 06/30/17 07/10/17  Enid DerryWagner, Angeliah Wisdom, PA-C  predniSONE (DELTASONE) 10 MG tablet Take 6 tablets on day 1, take 5 tablets on day 2, take 4 tablets on day 3, take 3 tablets on day 4, take 2 tablets on day 5, take 1 tablet on day 6 06/30/17   Enid DerryWagner, Kolten Ryback, PA-C  Prenatal Vit-Fe Fumarate-FA (MULTIVITAMIN-PRENATAL) 27-0.8 MG TABS tablet Take 1 tablet by mouth daily at 12 noon. 05/10/15   Hildred Laserherry, Anika, MD  promethazine (PHENERGAN) 25 MG tablet Take 1 tablet (25 mg total) by mouth every 6 (six) hours as needed for nausea or vomiting. 07/05/15   Hildred Laserherry, Anika, MD  ranitidine (ZANTAC) 150 MG tablet Take 1 tablet (150 mg total) by mouth 2 (two) times daily. 08/16/15   Hildred Laserherry, Anika, MD  venlafaxine XR (EFFEXOR-XR) 75 MG 24 hr capsule Take 1 capsule (75 mg total) by mouth daily with breakfast. 05/10/15   Hildred Laserherry, Anika, MD    Allergies Penicillin g and Sulfa antibiotics  Family History  Problem Relation  Age of Onset  . Hypertension Father     Social History Social History   Tobacco Use  . Smoking status: Never Smoker  . Smokeless tobacco: Never Used  Substance Use Topics  . Alcohol use: No    Alcohol/week: 0.0 oz  . Drug use: No     Review of Systems  Constitutional: No fever/chills Cardiovascular: No chest pain. Respiratory: No SOB. Gastrointestinal: No abdominal pain.  No nausea, no vomiting.  Musculoskeletal: Negative for musculoskeletal pain. Skin: Negative for rash, abrasions, lacerations,  ecchymosis.   ____________________________________________   PHYSICAL EXAM:  VITAL SIGNS: ED Triage Vitals  Enc Vitals Group     BP 06/30/17 0756 136/75     Pulse Rate 06/30/17 0756 82     Resp 06/30/17 0756 18     Temp 06/30/17 0756 97.7 F (36.5 C)     Temp Source 06/30/17 0756 Oral     SpO2 06/30/17 0756 97 %     Weight --      Height --      Head Circumference --      Peak Flow --      Pain Score 06/30/17 0751 8     Pain Loc --      Pain Edu? --      Excl. in GC? --      Constitutional: Alert and oriented. Well appearing and in no acute distress. Eyes: Conjunctivae are normal. PERRL. EOMI. Head: Atraumatic. ENT:      Ears: No pain or drainage around piercing.  Tenderness to palpation of pinna. No tenderness to palpation of tragus.  No mastoid tenderness.  Tender superior anterior cervical lymphadenopathy. Tympanic membranes pearly.       Nose: No congestion/rhinnorhea.      Mouth/Throat: Mucous membranes are moist. Oropharynx non erythematous. Tonsils non enlarged. No dental tenderness. No visible swelling to cheek.  Neck: No stridor.   Cardiovascular: Normal rate, regular rhythm.  Good peripheral circulation. Respiratory: Normal respiratory effort without tachypnea or retractions. Lungs CTAB. Good air entry to the bases with no decreased or absent breath sounds. Musculoskeletal: Full range of motion to all extremities. No gross deformities appreciated. Neurologic:  Normal speech and language. No gross focal neurologic deficits are appreciated.  Skin:  Skin is warm, dry and intact. No rash noted.    ____________________________________________   LABS (all labs ordered are listed, but only abnormal results are displayed)  Labs Reviewed - No data to display ____________________________________________  EKG   ____________________________________________  RADIOLOGY   No results  found.  ____________________________________________    PROCEDURES  Procedure(s) performed:    Procedures    Medications  dexamethasone (DECADRON) injection 10 mg (10 mg Intramuscular Given 06/30/17 0827)     ____________________________________________   INITIAL IMPRESSION / ASSESSMENT AND PLAN / ED COURSE  Pertinent labs & imaging results that were available during my care of the patient were reviewed by me and considered in my medical decision making (see chart for details).  Review of the [place] CSRS was performed in accordance of the [facility] prior to dispensing any controlled drugs.   Patient presented to the emergency department for evaluation of right ear pain for one way. Exam is consistent with lymphadenopathy and otitis externa.  Patient has an anaphylactic reaction with penicillin.  She will be given clindamycin to cover for staph and strep.  She will be given azithromycin to cover for H flu.  Dexamethasone shot was given for lymphadenopathy.  Patient will be discharged home with  prescriptions for prednisone, clindamycin, azithromycin, Corticosporin. Patient is to follow up with PCP and ENT as directed. Patient is given ED precautions to return to the ED for any worsening or new symptoms.     ____________________________________________  FINAL CLINICAL IMPRESSION(S) / ED DIAGNOSES  Final diagnoses:  Right ear pain  Lymphadenopathy      NEW MEDICATIONS STARTED DURING THIS VISIT:  ED Discharge Orders        Ordered    clindamycin (CLEOCIN) 300 MG capsule  3 times daily     06/30/17 0824    azithromycin (ZITHROMAX Z-PAK) 250 MG tablet     06/30/17 0824    predniSONE (DELTASONE) 10 MG tablet  Status:  Discontinued     06/30/17 0824    ciprofloxacin-dexamethasone (CIPRODEX) OTIC suspension  2 times daily,   Status:  Discontinued     06/30/17 0824    neomycin-polymyxin-hydrocortisone (CORTISPORIN) OTIC solution  3 times daily     06/30/17 0828    predniSONE  (DELTASONE) 10 MG tablet     06/30/17 1610          This chart was dictated using voice recognition software/Dragon. Despite best efforts to proofread, errors can occur which can change the meaning. Any change was purely unintentional.    Enid Derry, PA-C 06/30/17 1513    Dionne Bucy, MD 06/30/17 [phone]

## 2021-12-09 ENCOUNTER — Emergency Department
Admission: EM | Admit: 2021-12-09 | Discharge: 2021-12-09 | Disposition: A | Discharge status: Home / Self Care | Payer: Medicaid Other | Attending: Emergency Medicine | Admitting: Emergency Medicine

## 2021-12-09 ENCOUNTER — Emergency Department: Payer: Medicaid Other

## 2021-12-09 ENCOUNTER — Encounter: Payer: Self-pay | Admitting: Emergency Medicine

## 2021-12-09 ENCOUNTER — Other Ambulatory Visit: Payer: Self-pay

## 2021-12-09 DIAGNOSIS — W010XXA Fall on same level from slipping, tripping and stumbling without subsequent striking against object, initial encounter: Secondary | ICD-10-CM | POA: Insufficient documentation

## 2021-12-09 DIAGNOSIS — S99922A Unspecified injury of left foot, initial encounter: Secondary | ICD-10-CM | POA: Diagnosis present

## 2021-12-09 DIAGNOSIS — I10 Essential (primary) hypertension: Secondary | ICD-10-CM | POA: Diagnosis not present

## 2021-12-09 DIAGNOSIS — S93402A Sprain of unspecified ligament of left ankle, initial encounter: Secondary | ICD-10-CM

## 2021-12-09 MED ORDER — IBUPROFEN 600 MG PO TABS: 600.0000 mg | ORAL_TABLET | Freq: Three times a day (TID) | ORAL | 0 refills | Status: AC

## 2021-12-09 MED ORDER — OXYCODONE HCL 5 MG PO TABS
5.0000 mg | ORAL_TABLET | Freq: Four times a day (QID) | ORAL | 0 refills | Status: AC | PRN
Start: 2021-12-09 — End: ?

## 2023-08-19 HISTORY — PX: OTHER SURGICAL HISTORY: SHX169

## 2024-02-17 HISTORY — PX: ABDOMINAL HYSTERECTOMY: SHX81

## 2024-05-17 ENCOUNTER — Emergency Department
Admission: EM | Admit: 2024-05-17 | Discharge: 2024-05-17 | Disposition: A | Discharge status: Home / Self Care | Attending: Emergency Medicine | Admitting: Emergency Medicine

## 2024-05-17 ENCOUNTER — Emergency Department

## 2024-05-17 ENCOUNTER — Other Ambulatory Visit: Payer: Self-pay

## 2024-05-17 ENCOUNTER — Encounter: Payer: Self-pay | Admitting: Emergency Medicine

## 2024-05-17 DIAGNOSIS — R1033 Periumbilical pain: Secondary | ICD-10-CM | POA: Insufficient documentation

## 2024-05-17 DIAGNOSIS — R112 Nausea with vomiting, unspecified: Secondary | ICD-10-CM | POA: Insufficient documentation

## 2024-05-17 LAB — URINALYSIS, ROUTINE W REFLEX MICROSCOPIC
Bilirubin Urine: NEGATIVE
Glucose, UA: NEGATIVE mg/dL
Hgb urine dipstick: NEGATIVE
Ketones, ur: NEGATIVE mg/dL
Leukocytes,Ua: NEGATIVE
Nitrite: NEGATIVE
Protein, ur: NEGATIVE mg/dL
Specific Gravity, Urine: 1.025 (ref 1.005–1.030)
pH: 5 (ref 5.0–8.0)

## 2024-05-17 LAB — CBC
HCT: 38.9 % (ref 36.0–46.0)
Hemoglobin: 12.3 g/dL (ref 12.0–15.0)
MCH: 27.8 pg (ref 26.0–34.0)
MCHC: 31.6 g/dL (ref 30.0–36.0)
MCV: 87.8 fL (ref 80.0–100.0)
Platelets: 369 K/uL (ref 150–400)
RBC: 4.43 MIL/uL (ref 3.87–5.11)
RDW: 14.4 % (ref 11.5–15.5)
WBC: 11.6 K/uL — ABNORMAL HIGH (ref 4.0–10.5)
nRBC: 0 % (ref 0.0–0.2)

## 2024-05-17 LAB — COMPREHENSIVE METABOLIC PANEL WITH GFR
ALT: 40 U/L (ref 0–44)
AST: 57 U/L — ABNORMAL HIGH (ref 15–41)
Albumin: 4.2 g/dL (ref 3.5–5.0)
Alkaline Phosphatase: 183 U/L — ABNORMAL HIGH (ref 38–126)
Anion gap: 11 (ref 5–15)
BUN: 12 mg/dL (ref 6–20)
CO2: 25 mmol/L (ref 22–32)
Calcium: 9.6 mg/dL (ref 8.9–10.3)
Chloride: 106 mmol/L (ref 98–111)
Creatinine, Ser: 0.61 mg/dL (ref 0.44–1.00)
GFR, Estimated: >60 mL/min (ref >60)
Glucose, Bld: 103 mg/dL — ABNORMAL HIGH (ref 70–99)
Potassium: 4.2 mmol/L (ref 3.5–5.1)
Sodium: 143 mmol/L (ref 135–145)
Total Bilirubin: 0.4 mg/dL (ref 0.0–1.2)
Total Protein: 7.2 g/dL (ref 6.5–8.1)

## 2024-05-17 LAB — LIPASE, BLOOD: Lipase: 18 U/L (ref 11–51)

## 2024-05-17 MED ORDER — ONDANSETRON HCL 4 MG/2ML IJ SOLN
4.0000 mg | INTRAMUSCULAR | Status: AC
  Administered 2024-05-17: 4 mg via INTRAVENOUS
  Filled 2024-05-17: qty 2

## 2024-05-17 MED ORDER — DROPERIDOL 2.5 MG/ML IJ SOLN
2.5000 mg | Freq: Once | INTRAMUSCULAR | Status: AC
  Administered 2024-05-17: 2.5 mg via INTRAVENOUS
  Filled 2024-05-17: qty 2

## 2024-05-17 MED ORDER — IOHEXOL 300 MG/ML  SOLN
100.0000 mL | Freq: Once | INTRAMUSCULAR | Status: AC | PRN
  Administered 2024-05-17: 100 mL via INTRAVENOUS

## 2024-05-17 MED ORDER — MORPHINE SULFATE (PF) 4 MG/ML IV SOLN
4.0000 mg | Freq: Once | INTRAVENOUS | Status: AC
  Administered 2024-05-17: 4 mg via INTRAVENOUS
  Filled 2024-05-17: qty 1

## 2024-05-17 NOTE — ED Provider Notes (Signed)
 [facility] Provider Note    Event Date/Time   First MD Initiated Contact with Patient 05/17/24 0301     (approximate)   History   Abdominal Pain   HPI Barbara Hayes is a 38 y.o. female  with extensive prior surgical history that includes relatively recent hysterectomy, bariatric surgery, and prior cholecystectomy.  She presents for acute onset periumbilical pain associated with nausea and vomiting.  She said that she is visiting from out of town [facility]) and went to bed feeling normal, but she woke up with severe pain that has not let go since that time (about 3-1/2 hours ago).  She has vomited once but has persistent nausea.  Nothing in particular makes it better or worse.  She also reports that she has a history of kidney stones and was not sure if maybe that is what is going on but the pain is located right around her bellybutton.     Physical Exam   Triage Vital Signs: ED Triage Vitals  Encounter Vitals Group     BP 05/17/24 0257 (!) 159/105     Girls Systolic BP Percentile --      Girls Diastolic BP Percentile --      Boys Systolic BP Percentile --      Boys Diastolic BP Percentile --      Pulse Rate 05/17/24 0257 (!) 112     Resp 05/17/24 0257 20     Temp 05/17/24 0257 98.4 F (36.9 C)     Temp Source 05/17/24 0257 Oral     SpO2 05/17/24 0257 100 %     Weight 05/17/24 0251 97.1 kg (214 lb)     Height 05/17/24 0251 1.575 m (5' 2)     Head Circumference --      Peak Flow --      Pain Score 05/17/24 0250 9     Pain Loc --      Pain Education --      Exclude from Growth Chart --     Most recent vital signs: Vitals:   05/17/24 0257  BP: (!) 159/105  Pulse: (!) 112  Resp: 20  Temp: 98.4 F (36.9 C)  SpO2: 100%    General: Awake, appears uncomfortable but nontoxic, holding her abdomen and rocking back and forth. CV:  Good peripheral perfusion.  Mild tachycardia, regular rhythm. Resp:  Normal effort. Speaking easily and  comfortably, no accessory muscle usage nor intercostal retractions.   Abd:  Obese, abdomen is soft in general but feels somewhat firm around her umbilicus where she has some localized peritonitis.   ED Results / Procedures / Treatments   Labs (all labs ordered are listed, but only abnormal results are displayed) Labs Reviewed  COMPREHENSIVE METABOLIC PANEL WITH GFR - Abnormal; Notable for the following components:      Result Value   Glucose, Bld 103 (*)    AST 57 (*)    Alkaline Phosphatase 183 (*)    All other components within normal limits  CBC - Abnormal; Notable for the following components:   WBC 11.6 (*)    All other components within normal limits  URINALYSIS, ROUTINE W REFLEX MICROSCOPIC - Abnormal; Notable for the following components:   Color, Urine YELLOW (*)    APPearance HAZY (*)    All other components within normal limits  LIPASE, BLOOD      RADIOLOGY See ED course for details   PROCEDURES:  Critical Care performed: No  Procedures  IMPRESSION / MDM / ASSESSMENT AND PLAN / ED COURSE  I reviewed the triage vital signs and the nursing notes.                              Differential diagnosis includes, but is not limited to, SBO, ileus, diverticulitis, ureteral/renal colic  Patient's presentation is most consistent with acute presentation with potential threat to life or bodily function.  Labs/studies ordered: CBC, CMP, lipase, urinalysis  Interventions/Medications given:  Medications  ondansetron  (ZOFRAN ) injection 4 mg (4 mg Intravenous Given 05/17/24 0324)  morphine (PF) 4 MG/ML injection 4 mg (4 mg Intravenous Given 05/17/24 0324)  iohexol (OMNIPAQUE) 300 MG/ML solution 100 mL (100 mLs Intravenous Contrast Given 05/17/24 0401)  morphine (PF) 4 MG/ML injection 4 mg (4 mg Intravenous Given 05/17/24 0412)  droperidol (INAPSINE) 2.5 MG/ML injection 2.5 mg (2.5 mg Intravenous Given 05/17/24 0412)    (Note:  hospital course my include  additional interventions and/or labs/studies not listed above.)   Tachycardic and hypertensive, likely secondary to pain.  Strongly suspect SBO.  CBC shows minimal leukocytosis, the rest of her labs are pending.  I have ordered a CT scan as well as morphine 4 mg IV and Zofran  4 mg IV     Clinical Course as of 05/17/24 0603  Sun May 17, 2024  0407 Patient reports still having severe pain after morphine.  I will give another dose of morphine 4 g IV as well as droperidol 2.5 mg IV to both help with the nausea and as an adjuvant medication for the opioids [CF]  0436 CT ABDOMEN PELVIS W CONTRAST I independently viewed and interpreted the patient's abd/pelvis CT, as well as reviewing the radiologist's report.  I do not see any sign of air-fluid levels to suggest a bowel obstruction.  Radiology confirmed no acute findings although the patient does have some hepatomegaly.  I will reassess after her latest round of medication has had a chance to work [CF]  0602 Reassessed patient.  She has been sleeping comfortably and says that she feels much better.  I went over her reassuring results with no indication of an acute or emergent medical condition and she is fine with the plan to go home.   The patient's medical screening exam is reassuring with no indication of an emergent medical condition requiring hospitalization or additional evaluation at this point.  The patient is safe and appropriate for discharge and outpatient follow up. [CF]    Clinical Course User Index [CF] Gordan Huxley, MD     FINAL CLINICAL IMPRESSION(S) / ED DIAGNOSES   Final diagnoses:  Periumbilical abdominal pain     Rx / DC Orders   ED Discharge Orders     None        Note:  This document was prepared using Dragon voice recognition software and may include unintentional dictation errors.   Gordan Huxley, MD 05/17/24 [phone]

## 2024-05-17 NOTE — Discharge Instructions (Signed)

## 2024-05-17 NOTE — ED Triage Notes (Signed)
 Pt arrived via POV with c/o periumbilical pain, states the pain moves to LLQ, c/o nausea, took toradol and tylenol  around 12am, hx of hysterectomy and chole.
